# Patient Record
Sex: Male | Born: 1990
Health system: Southern US, Community
[De-identification: ages and names within clinical notes are randomized; demographics above are authoritative.]

## PROBLEM LIST (undated history)

## (undated) DIAGNOSIS — J45909 Unspecified asthma, uncomplicated: Secondary | ICD-10-CM

## (undated) DIAGNOSIS — T7840XA Allergy, unspecified, initial encounter: Secondary | ICD-10-CM

## (undated) DIAGNOSIS — K589 Irritable bowel syndrome without diarrhea: Secondary | ICD-10-CM

## (undated) HISTORY — DX: Irritable bowel syndrome, unspecified: K58.9

## (undated) HISTORY — DX: Unspecified asthma, uncomplicated: J45.909

## (undated) HISTORY — DX: Allergy, unspecified, initial encounter: T78.40XA

---
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
## 2011-01-05 LAB — CBC
HCT: 44.4 % (ref 36.0–49.0)
Hemoglobin: 15.5 g/dL (ref 12.0–16.0)
MCHC: 34.9 g/dL (ref 31.0–37.0)
MCV: 87 fL (ref 78.0–98.0)
Platelets: 164 10*3/uL (ref 150–400)

## 2011-01-05 LAB — DIFFERENTIAL
Eosinophils Absolute: 0.1 10*3/uL (ref 0.0–1.2)
Lymphs Abs: 1.5 10*3/uL (ref 1.1–4.8)
Monocytes Absolute: 0.6 10*3/uL (ref 0.2–1.2)
Monocytes Relative: 6 % (ref 3–11)
Neutro Abs: 9.1 10*3/uL — ABNORMAL HIGH (ref 1.7–8.0)

## 2011-01-05 LAB — BASIC METABOLIC PANEL
BUN: 13 mg/dL (ref 6–23)
CO2: 25 mEq/L (ref 19–32)
Calcium: 9.6 mg/dL (ref 8.4–10.5)
Chloride: 106 mEq/L (ref 96–112)
Glucose, Bld: 99 mg/dL (ref 70–99)

## 2013-08-20 ENCOUNTER — Ambulatory Visit (INDEPENDENT_AMBULATORY_CARE_PROVIDER_SITE_OTHER): Payer: BC Managed Care – PPO | Admitting: Family Medicine

## 2013-08-20 ENCOUNTER — Encounter: Payer: Self-pay | Admitting: Family Medicine

## 2013-08-20 VITALS — BP 128/80 | HR 76 | Temp 98.2°F | Resp 18 | Ht 68.0 in | Wt 139.4 lb

## 2013-08-20 DIAGNOSIS — L259 Unspecified contact dermatitis, unspecified cause: Secondary | ICD-10-CM

## 2013-08-20 DIAGNOSIS — L255 Unspecified contact dermatitis due to plants, except food: Secondary | ICD-10-CM

## 2013-08-20 DIAGNOSIS — L237 Allergic contact dermatitis due to plants, except food: Secondary | ICD-10-CM

## 2013-08-20 MED ORDER — PREDNISONE 20 MG PO TABS: ORAL_TABLET | ORAL | Status: DC

## 2013-08-20 MED ORDER — DIPHENHYDRAMINE HCL 25 MG PO CAPS
25.0000 mg | ORAL_CAPSULE | Freq: Once | ORAL | Status: AC
  Administered 2013-08-20: 25 mg via ORAL

## 2013-08-20 MED ORDER — NON FORMULARY
150.0000 mg | Freq: Once | Status: AC
  Administered 2013-08-20: 150 mg via ORAL

## 2013-08-20 NOTE — Progress Notes (Addendum)
Subjective:    Patient ID: Peter Kirby, male    DOB: 04-Jul-1991, 22 y.o.   MRN: [id number]  This chart was scribed for Meredith Staggers, MD by Greggory Stallion, Medical Scribe. This patient's care was started at 8:46 PM.  Authored by Silas Sacramento, M.D, but unable to change in [facility]   HPI HPI Comments: Peter Kirby is a 22 y.o. male who presents to the office complaining of an allergic reaction that started this morning. Pt states it started with a rash across his abdomen that moved to his groin, shoulders, back and around his lips and he has lip swelling. He has taken one Benadryl pill with no relief. Denies difficulty breathing, SOB. He states he has been hunting in the woods for the last week. Denies history of anaphylaxis or hospitalization due to allergic reactions.   There are no active problems to display for this patient.  No past medical history on file. No past surgical history on file. Allergies not on file Prior to Admission medications   Not on File   History   Social History   Marital Status: Single    Spouse Name: N/A    Number of Children: N/A   Years of Education: N/A   Occupational History   Not on file.   Social History Main Topics   Smoking status: Not on file   Smokeless tobacco: Not on file   Alcohol Use: Not on file   Drug Use: Not on file   Sexual Activity: Not on file   Other Topics Concern   Not on file   Social History Narrative   No narrative on file    Review of Systems  HENT: Positive for facial swelling (lips).   Respiratory: Negative for shortness of breath.   Genitourinary: Negative for discharge, scrotal swelling, difficulty urinating and penile pain.  Skin: Positive for rash.       Objective:   Physical Exam  Vitals reviewed. Constitutional: He is oriented to person, place, and time. He appears well-developed and well-nourished.  HENT:  Head: Normocephalic and atraumatic.  Right Ear: Tympanic membrane,  external ear and ear canal normal.  Left Ear: Tympanic membrane, external ear and ear canal normal.  Nose: No rhinorrhea.  Mouth/Throat: Oropharynx is clear and moist and mucous membranes are normal. No oropharyngeal exudate or posterior oropharyngeal erythema.  No ulcers, rash or blisters in mouth.   Eyes: Conjunctivae and EOM are normal. Pupils are equal, round, and reactive to light.  Erythema on medial canthi and soft tissue around both eyes with minimal induration, with right greater than left, on periorbital skin.   Neck: Neck supple.  Cardiovascular: Normal rate, regular rhythm, normal heart sounds and intact distal pulses.   No murmur heard. Pulmonary/Chest: Effort normal and breath sounds normal. No stridor. He has no wheezes. He has no rhonchi. He has no rales.  Abdominal: Soft. There is no tenderness.  Lymphadenopathy:    He has no cervical adenopathy.  Neurological: He is alert and oriented to person, place, and time.  Skin: Skin is warm and dry. Rash noted.  Multiple coalescent, papular excoriated areas. Patches to right abdomen, 3 patches on right anterior shoulder. Few anterior erythematous patches on the neck. Two excoriated areas to upper back without vesicles or papules. Few patches of excoriated papules on lateral face bilaterally and lower lip right at [place] border. Few small erythematous papules on L scrotum and lower abdominal wall - superior to genital area.  Psychiatric: He has a normal mood and affect. His behavior is normal.   Filed Vitals:   08/20/13 2041  BP: 128/80  Pulse: 76  Temp: 98.2 F (36.8 C)  TempSrc: Oral  Resp: 18  Height: 5\' 8"  (1.727 m)  Weight: 139 lb 6.4 oz (63.231 kg)  SpO2: 98%     Results for orders placed in visit on 08/20/13  GLUCOSE, POCT (MANUAL RESULT ENTRY)      Result Value Range   POC Glucose 78  70 - 99 mg/dl      Assessment & Plan:  Peter Kirby is a 22 y.o. male  Dermatitis, contact - Plan: diphenhydrAMINE  (BENADRYL) capsule 25 mg, POCT glucose (manual entry), NON FORMULARY 150 mg, predniSONE (DELTASONE) 20 MG tablet  Poison ivy - Plan: diphenhydrAMINE (BENADRYL) capsule 25 mg, POCT glucose (manual entry), NON FORMULARY 150 mg, predniSONE (DELTASONE) 20 MG tablet  Rash initially on abdomen, now progressive to genitals, face/periorbital. Suspected poison ivy as cause with hunting in woods this week. No respiratory sx's.  No intraoral lesions noted.   Start prednisone taper, cont Benadryl and Zantac otc prn - dose of each given here. Overnight ER precautions discussed.   Meds ordered this encounter  Medications   dexmethylphenidate (FOCALIN) 10 MG tablet    Sig: Take 10 mg by mouth as needed.   diphenhydrAMINE (BENADRYL) capsule 25 mg    Sig:    NON FORMULARY 150 mg    Sig:     Order Specific Question:  Brand Name:    Answer:  Zantac    Order Specific Question:  Generic name:    Answer:  Ranitadine    Order Specific Question:  Form:    Answer:  Cap [7]    Order Specific Question:  Length of Therapy:    Answer:  1 day    Order Specific Question:  Reason for Non-Formulary    Answer:  Brand name not available    Order Specific Question:  How soon needed? (normally 72 hrs needed to procure):    Answer:  0-24 hrs   predniSONE (DELTASONE) 20 MG tablet    Sig: 3 tabs by mouth each day for 3 days, 2 tabs by mouth each day for 2 days, 1 tab by mouth each day for 2 days, 1/2 tab by mouth each day for 2 days.    Dispense:  16 tablet    Refill:  0    Patient Instructions  Start prednisone tonight, benadryl 25mg  - 1-2 every 4 to 6 hours as needed for itching. You can also take over the counter Zantac (75mg  is ok - twice per day, but 150mg  given to you in the office tonight, as this can also help with itching with allergic reactions. This appears to be due to poison ivy, so should improve as discussed, but return to the clinic or go to the nearest emergency room if any of your symptoms worsen or  new symptoms occur. Poison [employer] ivy is a inflammation of the skin (contact dermatitis) caused by touching the allergens on the leaves of the ivy plant following previous exposure to the plant. The rash usually appears 48 hours after exposure. The rash is usually bumps (papules) or blisters (vesicles) in a linear pattern. Depending on your own sensitivity, the rash may simply cause redness and itching, or it may also progress to blisters which may break open. These must be well cared for to prevent secondary bacterial (germ) infection, followed by scarring.  Keep any open areas dry, clean, dressed, and covered with an antibacterial ointment if needed. The eyes may also get puffy. The puffiness is worst in the morning and gets better as the day progresses. This dermatitis usually heals without scarring, within 2 to 3 weeks without treatment. HOME CARE INSTRUCTIONS  Thoroughly wash with soap and water as soon as you have been exposed to poison ivy. You have about one half hour to remove the plant resin before it will cause the rash. This washing will destroy the oil or antigen on the skin that is causing, or will cause, the rash. Be sure to wash under your fingernails as any plant resin there will continue to spread the rash. Do not rub skin vigorously when washing affected area. Poison ivy cannot spread if no oil from the plant remains on your body. A rash that has progressed to weeping sores will not spread the rash unless you have not washed thoroughly. It is also important to wash any clothes you have been wearing as these may carry active allergens. The rash will return if you wear the unwashed clothing, even several days later. Avoidance of the plant in the future is the best measure. Poison ivy plant can be recognized by the number of leaves. Generally, poison ivy has three leaves with flowering branches on a single stem. Diphenhydramine may be purchased over the counter and used as needed for  itching. Do not drive with this medication if it makes you drowsy.Ask your caregiver about medication for children. SEEK MEDICAL CARE IF:  Open sores develop.  Redness spreads beyond area of rash.  You notice purulent (pus-like) discharge.  You have increased pain.  Other signs of infection develop (such as fever). Document Released: 09/08/2000 Document Revised: 12/04/2011 Document Reviewed: 07/28/2009 [facility] Patient Information 2014 [place], [place].    I personally performed the services described in this documentation, which was scribed in my presence. The recorded information has been reviewed and considered, and addended by me as needed.

## 2013-08-20 NOTE — Patient Instructions (Signed)
Start prednisone tonight, benadryl 25mg  - 1-2 every 4 to 6 hours as needed for itching. You can also take over the counter Zantac (75mg  is ok - twice per day, but 150mg  given to you in the office tonight, as this can also help with itching with allergic reactions. This appears to be due to poison ivy, so should improve as discussed, but return to the clinic or go to the nearest emergency room if any of your symptoms worsen or new symptoms occur. Poison [employer] ivy is a inflammation of the skin (contact dermatitis) caused by touching the allergens on the leaves of the ivy plant following previous exposure to the plant. The rash usually appears 48 hours after exposure. The rash is usually bumps (papules) or blisters (vesicles) in a linear pattern. Depending on your own sensitivity, the rash may simply cause redness and itching, or it may also progress to blisters which may break open. These must be well cared for to prevent secondary bacterial (germ) infection, followed by scarring. Keep any open areas dry, clean, dressed, and covered with an antibacterial ointment if needed. The eyes may also get puffy. The puffiness is worst in the morning and gets better as the day progresses. This dermatitis usually heals without scarring, within 2 to 3 weeks without treatment. HOME CARE INSTRUCTIONS  Thoroughly wash with soap and water as soon as you have been exposed to poison ivy. You have about one half hour to remove the plant resin before it will cause the rash. This washing will destroy the oil or antigen on the skin that is causing, or will cause, the rash. Be sure to wash under your fingernails as any plant resin there will continue to spread the rash. Do not rub skin vigorously when washing affected area. Poison ivy cannot spread if no oil from the plant remains on your body. A rash that has progressed to weeping sores will not spread the rash unless you have not washed thoroughly. It is also important to wash any  clothes you have been wearing as these may carry active allergens. The rash will return if you wear the unwashed clothing, even several days later. Avoidance of the plant in the future is the best measure. Poison ivy plant can be recognized by the number of leaves. Generally, poison ivy has three leaves with flowering branches on a single stem. Diphenhydramine may be purchased over the counter and used as needed for itching. Do not drive with this medication if it makes you drowsy.Ask your caregiver about medication for children. SEEK MEDICAL CARE IF:  Open sores develop.  Redness spreads beyond area of rash.  You notice purulent (pus-like) discharge.  You have increased pain.  Other signs of infection develop (such as fever). Document Released: 09/08/2000 Document Revised: 12/04/2011 Document Reviewed: 07/28/2009 [facility] Patient Information 2014 [place], [place].

## 2015-05-06 ENCOUNTER — Ambulatory Visit (INDEPENDENT_AMBULATORY_CARE_PROVIDER_SITE_OTHER): Payer: BC Managed Care – PPO | Admitting: Otolaryngology

## 2015-05-06 DIAGNOSIS — R040 Epistaxis: Secondary | ICD-10-CM | POA: Diagnosis not present

## 2019-06-11 ENCOUNTER — Other Ambulatory Visit: Payer: Self-pay

## 2019-06-11 DIAGNOSIS — U071 COVID-19: Secondary | ICD-10-CM | POA: Diagnosis not present

## 2019-06-12 LAB — NOVEL CORONAVIRUS, NAA: SARS-CoV-2, NAA: NOT DETECTED

## 2019-06-16 ENCOUNTER — Other Ambulatory Visit: Payer: Self-pay

## 2019-06-16 DIAGNOSIS — Z20822 Contact with and (suspected) exposure to covid-19: Secondary | ICD-10-CM

## 2019-06-17 LAB — NOVEL CORONAVIRUS, NAA: SARS-CoV-2, NAA: NOT DETECTED

## 2019-06-18 ENCOUNTER — Other Ambulatory Visit: Payer: Self-pay

## 2019-06-18 DIAGNOSIS — Z20822 Contact with and (suspected) exposure to covid-19: Secondary | ICD-10-CM

## 2019-06-20 LAB — NOVEL CORONAVIRUS, NAA: SARS-CoV-2, NAA: NOT DETECTED

## 2019-06-25 ENCOUNTER — Other Ambulatory Visit: Payer: Self-pay

## 2019-06-25 DIAGNOSIS — Z20822 Contact with and (suspected) exposure to covid-19: Secondary | ICD-10-CM

## 2019-06-26 LAB — NOVEL CORONAVIRUS, NAA: SARS-CoV-2, NAA: NOT DETECTED

## 2019-09-08 DIAGNOSIS — Z20828 Contact with and (suspected) exposure to other viral communicable diseases: Secondary | ICD-10-CM | POA: Diagnosis not present

## 2019-09-08 DIAGNOSIS — R519 Headache, unspecified: Secondary | ICD-10-CM | POA: Diagnosis not present

## 2019-09-08 DIAGNOSIS — R52 Pain, unspecified: Secondary | ICD-10-CM | POA: Diagnosis not present

## 2019-09-09 DIAGNOSIS — Z20828 Contact with and (suspected) exposure to other viral communicable diseases: Secondary | ICD-10-CM | POA: Diagnosis not present

## 2019-10-09 ENCOUNTER — Other Ambulatory Visit (HOSPITAL_COMMUNITY): Payer: Self-pay | Admitting: Internal Medicine

## 2019-10-09 ENCOUNTER — Ambulatory Visit (HOSPITAL_COMMUNITY)
Admission: RE | Admit: 2019-10-09 | Discharge: 2019-10-09 | Disposition: A | Discharge status: Home / Self Care | Payer: BC Managed Care – PPO | Source: Ambulatory Visit | Attending: Internal Medicine | Admitting: Internal Medicine

## 2019-10-09 ENCOUNTER — Other Ambulatory Visit: Payer: Self-pay

## 2019-10-09 DIAGNOSIS — R0602 Shortness of breath: Secondary | ICD-10-CM | POA: Diagnosis not present

## 2020-05-17 DIAGNOSIS — J069 Acute upper respiratory infection, unspecified: Secondary | ICD-10-CM | POA: Diagnosis not present

## 2020-05-17 DIAGNOSIS — Z20822 Contact with and (suspected) exposure to covid-19: Secondary | ICD-10-CM | POA: Diagnosis not present

## 2020-05-17 DIAGNOSIS — B009 Herpesviral infection, unspecified: Secondary | ICD-10-CM | POA: Diagnosis not present

## 2020-05-17 DIAGNOSIS — R070 Pain in throat: Secondary | ICD-10-CM | POA: Diagnosis not present

## 2020-05-19 ENCOUNTER — Other Ambulatory Visit: Payer: BC Managed Care – PPO

## 2020-05-19 ENCOUNTER — Other Ambulatory Visit: Payer: Self-pay

## 2020-05-19 DIAGNOSIS — Z20822 Contact with and (suspected) exposure to covid-19: Secondary | ICD-10-CM | POA: Diagnosis not present

## 2020-05-21 LAB — SARS-COV-2, NAA 2 DAY TAT

## 2020-05-21 LAB — NOVEL CORONAVIRUS, NAA: SARS-CoV-2, NAA: NOT DETECTED

## 2020-07-23 DIAGNOSIS — R197 Diarrhea, unspecified: Secondary | ICD-10-CM | POA: Diagnosis not present

## 2020-07-27 DIAGNOSIS — R197 Diarrhea, unspecified: Secondary | ICD-10-CM | POA: Diagnosis not present

## 2020-07-27 DIAGNOSIS — K589 Irritable bowel syndrome without diarrhea: Secondary | ICD-10-CM | POA: Diagnosis not present

## 2020-09-01 ENCOUNTER — Encounter: Payer: Self-pay | Admitting: Internal Medicine

## 2020-10-21 ENCOUNTER — Telehealth: Payer: Self-pay | Admitting: *Deleted

## 2020-10-21 ENCOUNTER — Encounter: Payer: Self-pay | Admitting: Internal Medicine

## 2020-10-21 ENCOUNTER — Other Ambulatory Visit: Payer: Self-pay

## 2020-10-21 ENCOUNTER — Ambulatory Visit (INDEPENDENT_AMBULATORY_CARE_PROVIDER_SITE_OTHER): Payer: BC Managed Care – PPO | Admitting: Nurse Practitioner

## 2020-10-21 ENCOUNTER — Encounter: Payer: Self-pay | Admitting: Nurse Practitioner

## 2020-10-21 DIAGNOSIS — R197 Diarrhea, unspecified: Secondary | ICD-10-CM | POA: Insufficient documentation

## 2020-10-21 DIAGNOSIS — R634 Abnormal weight loss: Secondary | ICD-10-CM | POA: Insufficient documentation

## 2020-10-21 DIAGNOSIS — R109 Unspecified abdominal pain: Secondary | ICD-10-CM

## 2020-10-21 HISTORY — DX: Diarrhea, unspecified: R19.7

## 2020-10-21 HISTORY — DX: Unspecified abdominal pain: R10.9

## 2020-10-21 HISTORY — DX: Abnormal weight loss: R63.4

## 2020-10-21 MED ORDER — DICYCLOMINE HCL 10 MG PO CAPS: 10.0000 mg | ORAL_CAPSULE | Freq: Three times a day (TID) | ORAL | 2 refills | Status: DC | PRN

## 2020-10-21 NOTE — Telephone Encounter (Signed)
Called pt and provided CT appt details. This date/time did not work and so he has been provided # to CS to r/s.  He has been scheduled for TCS with Dr. Jena Gauss, propofol, ASA 1/2 on 2/18 at 1:00pm. Aware will send instructions with covid test appt. He voiced understanding/   PA for CT A/P approved via [employer]. Auth# [id number] DOS 10/21/2020-11/19/2020 No PA is required for procedure.

## 2020-10-21 NOTE — Patient Instructions (Signed)
Your health issues we discussed today were:   Abdominal pain with diarrhea and significant weight loss: 1. As we discussed, this very well could be your previous IBS symptoms 2. However, because things feel "different" than your typical IBS we will plan for further evaluation 3. For now, start taking a probiotic for 1 to 2 months to see if this helps with your worsening loose stools after the foodborne illness 4. We've had good success with Align, Restora, [employer], [employer], and [employer]; however there are many good options and you can discuss with the pharmacist if you would like further guidance. 5. As we discussed, your stool test did not show any significant colon infection 6. I sent a prescription to your pharmacy for Bentyl 10 mg.  You can take this 3 times a day, as needed for abdominal pain or diarrhea 7. This is not to "fix everything" but to help you feel better while we continue to evaluate your symptoms 8. We will help schedule the CT of your abdomen and pelvis for you to check for concerning findings 9. We will also schedule you for colonoscopy to further evaluate 10. Further recommendations will follow your testing and colonoscopy 11. Call us if you have any worsening or severe symptoms  Overall I recommend:  1. Continue other current medications 2. Return for follow-up in 3 months 3. Call us if you have any questions or concerns   ---------------------------------------------------------------  I am glad you have gotten your COVID-19 vaccination!  Even though you are fully vaccinated you should continue to follow CDC and state/local guidelines.  ---------------------------------------------------------------   At [facility] we value your feedback. You may receive a survey about your visit today. Please share your experience as we strive to create trusting relationships with our patients to provide genuine, compassionate, quality care.  We  appreciate your understanding and patience as we review any laboratory studies, imaging, and other diagnostic tests that are ordered as we care for you. Our office policy is 5 business days for review of these results, and any emergent or urgent results are addressed in a timely manner for your best interest. If you do not hear from our office in 1 week, please contact us.   We also encourage the use of MyChart, which contains your medical information for your review as well. If you are not enrolled in this feature, an access code is on this after visit summary for your convenience. Thank you for allowing [place] to be involved in your care.  It was great to see you today!  I hope you have a safe and warm winter!!

## 2020-10-21 NOTE — Progress Notes (Signed)
Primary Care Physician:  Asencion Noble, MD Primary Gastroenterologist:  Dr. Gala Romney  Chief Complaint  Patient presents with  . Diarrhea    IBS symptoms  . Abdominal Pain    HPI:   Peter Kirby is a 30 y.o. male who presents on referral from primary care due to IBS symptoms.  Reviewed information provided with referral including office visit dated 07/23/2020.  At that time noted worsening diarrhea with history of IBS with recent watery stools 3 or more times a day with abdominal cramping and bloating.  No sugarless gum or candies.  Recommended GI pathogen panel and Lomotil, avoid dairy.  Query candidate for Viberzi.  GI pathogen panel was completed 07/27/2020 which was negative.  C. difficile also noted to be negative on GI path panel specifically.  No history of colonoscopy found in our system.  Today he states he is doing okay overall. He states he's always had IBS, waxing and waning in severity. Worse symptoms in grade and high school; improved in college. Recently he has begun having recurrent symptoms. Restarted September or October of 2021. Stools were watery, didn't seem like "normal IBS" symptoms for him. At that time he had a test which showed "food-borne illness". His clear/watery stools improved, but still having 3-4 loose diarrhea stools a day. 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 Eats healthy, significant fiber, low fat. He does try and stay active/exercise. He does have significant abdominal pain with his stools which can be significant/severe. Doesn't feel like he has a lot of life stress. Pain is crampy, lower abdomen; not sharp. Denies hematochezia, melena, N/V, fever, chills. He has had some weight loss (about 10 lbs in the past 3 months). His diet and activity hasn't changed. Denies URI or flu-like symptoms. Denies loss of sense of taste or smell. The patient has received COVID-19 vaccination(s). Denies chest pain, dyspnea,  dizziness, lightheadedness, syncope, near syncope. Denies any other upper or lower GI symptoms.  Past Medical History:  Diagnosis Date  . Asthma    resolved; childhood exercise induced    Past Surgical History:  Procedure Laterality Date  . NO PAST SURGERIES      No current outpatient medications on file.   No current facility-administered medications for this visit.    Allergies as of 10/21/2020 - Review Complete 10/21/2020  Allergen Reaction Noted  . Bee venom  08/20/2013    Family History  Problem Relation Age of Onset  . Diabetes Father   . Diabetes Maternal Grandfather   . COPD Paternal Grandmother   . Diabetes Paternal Grandfather   . Colon cancer Neg Hx   . Gastric cancer Neg Hx   . Esophageal cancer Neg Hx     Social History   Socioeconomic History  . Marital status: Single    Spouse name: Not on file  . Number of children: Not on file  . Years of education: Not on file  . Highest education level: Not on file  Occupational History  . Not on file  Tobacco Use  . Smoking status: Never Smoker  . Smokeless tobacco: Never Used  Vaping Use  . Vaping Use: Never used  Substance and Sexual Activity  . Alcohol use: Yes    Comment: social on weekend typically 2-4 drinks  . Drug use: Never  . Sexual activity: Not on file  Other Topics Concern  . Not on file  Social History Narrative  . Not on  file   Social Determinants of Health   Financial Resource Strain: Not on file  Food Insecurity: Not on file  Transportation Needs: Not on file  Physical Activity: Not on file  Stress: Not on file  Social Connections: Not on file  Intimate Partner Violence: Not on file    Subjective: Review of Systems  Constitutional: Positive for weight loss. Negative for chills, fever and malaise/fatigue.  HENT: Negative for congestion and sore throat.   Respiratory: Negative for cough and shortness of breath.   Cardiovascular: Negative for chest pain and palpitations.   Gastrointestinal: Positive for abdominal pain and diarrhea. Negative for blood in stool, constipation, heartburn, melena, nausea and vomiting.  Musculoskeletal: Negative for joint pain and myalgias.  Skin: Negative for rash.  Neurological: Negative for dizziness and weakness.  Endo/Heme/Allergies: Does not bruise/bleed easily.  Psychiatric/Behavioral: Negative for depression. The patient is not nervous/anxious.   All other systems reviewed and are negative.      Objective: BP 119/81   Pulse 75   Temp (!) 97.5 F (36.4 C) (Temporal)   Ht _0  (1.727 m)   Wt 159 lb 1.6 oz (72.2 kg)   BMI 24.19 kg/m  Physical Exam Vitals and nursing note reviewed.  Constitutional:      General: He is not in acute distress.    Appearance: Normal appearance. He is well-developed and normal weight. He is not ill-appearing, toxic-appearing or diaphoretic.  HENT:     Head: Normocephalic and atraumatic.     Nose: No congestion or rhinorrhea.  Eyes:     General: No scleral icterus. Cardiovascular:     Rate and Rhythm: Normal rate and regular rhythm.     Heart sounds: Normal heart sounds.  Pulmonary:     Effort: Pulmonary effort is normal.     Breath sounds: Normal breath sounds.  Abdominal:     General: Bowel sounds are normal. There is no distension.     Palpations: Abdomen is soft. There is no hepatomegaly, splenomegaly or mass.     Tenderness: There is no abdominal tenderness. There is no guarding or rebound.     Hernia: No hernia is present.  Musculoskeletal:     Cervical back: Neck supple.  Skin:    General: Skin is warm and dry.     Coloration: Skin is not jaundiced.     Findings: No bruising or rash.  Neurological:     General: No focal deficit present.     Mental Status: He is alert and oriented to person, place, and time. Mental status is at baseline.  Psychiatric:        Mood and Affect: Mood normal.        Behavior: Behavior normal.        Thought Content: Thought content  normal.      Assessment:  Pleasant 30 year old male presents on referral from primary care due to IBS symptoms.  Previous GI pathogen panel completed negative for C. difficile and others.  No history of colonoscopy.  IBS symptoms (diarrhea and abdominal pain): Notes chronic IBS since childhood, waxing and waning in severity improved during college years but has restarted as of about 4 months ago.  Symptoms seemed a bit atypical for his normal IBS symptoms persistent 3-4 loose stools a day, was told he had food poisoning prior to that with worsening stools.  Associated abdominal pain in the lower abdomen.  Weight loss: He has noted some unintentional weight loss of about 10 pounds in the past  3 months despite no change in activity or diet.  Given his total consolation symptoms this is a bit concerning and we will proceed with colonoscopy to evaluate.  However, if he did have food poisoning and is having persistent diarrhea this could be responsible as well.  Proceed with colonoscopy on propofol/MAC by Dr. Gala Romney in near future: the risks, benefits, and alternatives have been discussed with the patient in detail. The patient states understanding and desires to proceed.  The patient is not currently on any medications.  Given his age we will plan for the procedure on propofol/MAC to promote adequate sedation.  ASA I/II   Plan: 1. Trialof Align x 1-2 months 2. Bentyl 10 mg tid prn 3. CT Abdomen/Pelvis 4. Colonoscopy as described above 5. Follow-up in 3 months    Thank you for allowing [place] to participate in the care of [place], DNP, AGNP-C Adult & Gerontological Nurse Practitioner [facility]   10/21/2020 9:39 AM   Disclaimer: This note was dictated with voice recognition software. Similar sounding words can inadvertently be transcribed and may not be corrected upon review.

## 2020-10-21 NOTE — H&P (View-Only) (Signed)
Primary Care Physician:  Asencion Noble, MD Primary Gastroenterologist:  Dr. Gala Romney  Chief Complaint  Patient presents with  . Diarrhea    IBS symptoms  . Abdominal Pain    HPI:   Peter Kirby is a 30 y.o. male who presents on referral from primary care due to IBS symptoms.  Reviewed information provided with referral including office visit dated 07/23/2020.  At that time noted worsening diarrhea with history of IBS with recent watery stools 3 or more times a day with abdominal cramping and bloating.  No sugarless gum or candies.  Recommended GI pathogen panel and Lomotil, avoid dairy.  Query candidate for Viberzi.  GI pathogen panel was completed 07/27/2020 which was negative.  C. difficile also noted to be negative on GI path panel specifically.  No history of colonoscopy found in our system.  Today he states he is doing okay overall. He states he's always had IBS, waxing and waning in severity. Worse symptoms in grade and high school; improved in college. Recently he has begun having recurrent symptoms. Restarted September or October of 2021. Stools were watery, didn't seem like "normal IBS" symptoms for him. At that time he had a test which showed "food-borne illness". His clear/watery stools improved, but still having 3-4 loose diarrhea stools a day. 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 Eats healthy, significant fiber, low fat. He does try and stay active/exercise. He does have significant abdominal pain with his stools which can be significant/severe. Doesn't feel like he has a lot of life stress. Pain is crampy, lower abdomen; not sharp. Denies hematochezia, melena, N/V, fever, chills. He has had some weight loss (about 10 lbs in the past 3 months). His diet and activity hasn't changed. Denies URI or flu-like symptoms. Denies loss of sense of taste or smell. The patient has received COVID-19 vaccination(s). Denies chest pain, dyspnea,  dizziness, lightheadedness, syncope, near syncope. Denies any other upper or lower GI symptoms.  Past Medical History:  Diagnosis Date  . Asthma    resolved; childhood exercise induced    Past Surgical History:  Procedure Laterality Date  . NO PAST SURGERIES      No current outpatient medications on file.   No current facility-administered medications for this visit.    Allergies as of 10/21/2020 - Review Complete 10/21/2020  Allergen Reaction Noted  . Bee venom  08/20/2013    Family History  Problem Relation Age of Onset  . Diabetes Father   . Diabetes Maternal Grandfather   . COPD Paternal Grandmother   . Diabetes Paternal Grandfather   . Colon cancer Neg Hx   . Gastric cancer Neg Hx   . Esophageal cancer Neg Hx     Social History   Socioeconomic History  . Marital status: Single    Spouse name: Not on file  . Number of children: Not on file  . Years of education: Not on file  . Highest education level: Not on file  Occupational History  . Not on file  Tobacco Use  . Smoking status: Never Smoker  . Smokeless tobacco: Never Used  Vaping Use  . Vaping Use: Never used  Substance and Sexual Activity  . Alcohol use: Yes    Comment: social on weekend typically 2-4 drinks  . Drug use: Never  . Sexual activity: Not on file  Other Topics Concern  . Not on file  Social History Narrative  . Not on  file   Social Determinants of Health   Financial Resource Strain: Not on file  Food Insecurity: Not on file  Transportation Needs: Not on file  Physical Activity: Not on file  Stress: Not on file  Social Connections: Not on file  Intimate Partner Violence: Not on file    Subjective: Review of Systems  Constitutional: Positive for weight loss. Negative for chills, fever and malaise/fatigue.  HENT: Negative for congestion and sore throat.   Respiratory: Negative for cough and shortness of breath.   Cardiovascular: Negative for chest pain and palpitations.   Gastrointestinal: Positive for abdominal pain and diarrhea. Negative for blood in stool, constipation, heartburn, melena, nausea and vomiting.  Musculoskeletal: Negative for joint pain and myalgias.  Skin: Negative for rash.  Neurological: Negative for dizziness and weakness.  Endo/Heme/Allergies: Does not bruise/bleed easily.  Psychiatric/Behavioral: Negative for depression. The patient is not nervous/anxious.   All other systems reviewed and are negative.      Objective: BP 119/81   Pulse 75   Temp (!) 97.5 F (36.4 C) (Temporal)   Ht _0  (1.727 m)   Wt 159 lb 1.6 oz (72.2 kg)   BMI 24.19 kg/m  Physical Exam Vitals and nursing note reviewed.  Constitutional:      General: He is not in acute distress.    Appearance: Normal appearance. He is well-developed and normal weight. He is not ill-appearing, toxic-appearing or diaphoretic.  HENT:     Head: Normocephalic and atraumatic.     Nose: No congestion or rhinorrhea.  Eyes:     General: No scleral icterus. Cardiovascular:     Rate and Rhythm: Normal rate and regular rhythm.     Heart sounds: Normal heart sounds.  Pulmonary:     Effort: Pulmonary effort is normal.     Breath sounds: Normal breath sounds.  Abdominal:     General: Bowel sounds are normal. There is no distension.     Palpations: Abdomen is soft. There is no hepatomegaly, splenomegaly or mass.     Tenderness: There is no abdominal tenderness. There is no guarding or rebound.     Hernia: No hernia is present.  Musculoskeletal:     Cervical back: Neck supple.  Skin:    General: Skin is warm and dry.     Coloration: Skin is not jaundiced.     Findings: No bruising or rash.  Neurological:     General: No focal deficit present.     Mental Status: He is alert and oriented to person, place, and time. Mental status is at baseline.  Psychiatric:        Mood and Affect: Mood normal.        Behavior: Behavior normal.        Thought Content: Thought content  normal.      Assessment:  Pleasant 30 year old male presents on referral from primary care due to IBS symptoms.  Previous GI pathogen panel completed negative for C. difficile and others.  No history of colonoscopy.  IBS symptoms (diarrhea and abdominal pain): Notes chronic IBS since childhood, waxing and waning in severity improved during college years but has restarted as of about 4 months ago.  Symptoms seemed a bit atypical for his normal IBS symptoms persistent 3-4 loose stools a day, was told he had food poisoning prior to that with worsening stools.  Associated abdominal pain in the lower abdomen.  Weight loss: He has noted some unintentional weight loss of about 10 pounds in the past  3 months despite no change in activity or diet.  Given his total consolation symptoms this is a bit concerning and we will proceed with colonoscopy to evaluate.  However, if he did have food poisoning and is having persistent diarrhea this could be responsible as well.  Proceed with colonoscopy on propofol/MAC by Dr. Gala Romney in near future: the risks, benefits, and alternatives have been discussed with the patient in detail. The patient states understanding and desires to proceed.  The patient is not currently on any medications.  Given his age we will plan for the procedure on propofol/MAC to promote adequate sedation.  ASA I/II   Plan: 1. Trialof Align x 1-2 months 2. Bentyl 10 mg tid prn 3. CT Abdomen/Pelvis 4. Colonoscopy as described above 5. Follow-up in 3 months    Thank you for allowing [place] to participate in the care of [place], DNP, AGNP-C Adult & Gerontological Nurse Practitioner [facility]   10/21/2020 9:39 AM   Disclaimer: This note was dictated with voice recognition software. Similar sounding words can inadvertently be transcribed and may not be corrected upon review.

## 2020-11-04 ENCOUNTER — Ambulatory Visit (HOSPITAL_COMMUNITY): Payer: BC Managed Care – PPO

## 2020-11-08 ENCOUNTER — Encounter (HOSPITAL_COMMUNITY): Payer: Self-pay

## 2020-11-08 ENCOUNTER — Other Ambulatory Visit: Payer: Self-pay

## 2020-11-08 ENCOUNTER — Ambulatory Visit (HOSPITAL_COMMUNITY)
Admission: RE | Admit: 2020-11-08 | Discharge: 2020-11-08 | Disposition: A | Discharge status: Home / Self Care | Payer: BC Managed Care – PPO | Source: Ambulatory Visit | Attending: Nurse Practitioner | Admitting: Nurse Practitioner

## 2020-11-08 DIAGNOSIS — R197 Diarrhea, unspecified: Secondary | ICD-10-CM | POA: Insufficient documentation

## 2020-11-08 DIAGNOSIS — R109 Unspecified abdominal pain: Secondary | ICD-10-CM | POA: Diagnosis not present

## 2020-11-08 MED ORDER — IOHEXOL 300 MG/ML  SOLN
100.0000 mL | Freq: Once | INTRAMUSCULAR | Status: AC | PRN
  Administered 2020-11-08: 100 mL via INTRAVENOUS

## 2020-11-09 ENCOUNTER — Telehealth: Payer: Self-pay | Admitting: Nurse Practitioner

## 2020-11-09 DIAGNOSIS — R634 Abnormal weight loss: Secondary | ICD-10-CM

## 2020-11-09 DIAGNOSIS — R197 Diarrhea, unspecified: Secondary | ICD-10-CM

## 2020-11-09 DIAGNOSIS — R109 Unspecified abdominal pain: Secondary | ICD-10-CM

## 2020-11-09 NOTE — Telephone Encounter (Signed)
Spoke with pt. Pt was notified of Wynne Dust, NP and Dr. Jeanella Flattery recommendations. Lab orders were mailed to pt per pts request.

## 2020-11-09 NOTE — Telephone Encounter (Signed)
Please tell the patient that I was reviewing his case with Dr. Jena Gauss who agreed with current recommendations, However, given his age, ethnicity, and other risk factors I'd like to check him for celiac disease as well (allergy to gluten which is the protein in wheat). This is done with a simple blood test, which I have ordered. He can have this done at his convenience. Let us know if he has any questions or concerns.

## 2020-11-10 ENCOUNTER — Other Ambulatory Visit: Payer: Self-pay

## 2020-11-10 ENCOUNTER — Other Ambulatory Visit (HOSPITAL_COMMUNITY)
Admission: RE | Admit: 2020-11-10 | Discharge: 2020-11-10 | Disposition: A | Discharge status: Home / Self Care | Payer: BC Managed Care – PPO | Source: Ambulatory Visit | Attending: Internal Medicine | Admitting: Internal Medicine

## 2020-11-10 DIAGNOSIS — Z20822 Contact with and (suspected) exposure to covid-19: Secondary | ICD-10-CM | POA: Insufficient documentation

## 2020-11-10 DIAGNOSIS — Z01812 Encounter for preprocedural laboratory examination: Secondary | ICD-10-CM | POA: Insufficient documentation

## 2020-11-10 DIAGNOSIS — R1084 Generalized abdominal pain: Secondary | ICD-10-CM | POA: Diagnosis not present

## 2020-11-10 DIAGNOSIS — Z8719 Personal history of other diseases of the digestive system: Secondary | ICD-10-CM | POA: Diagnosis not present

## 2020-11-10 DIAGNOSIS — R197 Diarrhea, unspecified: Secondary | ICD-10-CM | POA: Diagnosis not present

## 2020-11-10 DIAGNOSIS — R634 Abnormal weight loss: Secondary | ICD-10-CM | POA: Diagnosis not present

## 2020-11-11 LAB — SARS CORONAVIRUS 2 (TAT 6-24 HRS): SARS Coronavirus 2: NEGATIVE

## 2020-11-12 ENCOUNTER — Ambulatory Visit (HOSPITAL_COMMUNITY): Payer: BC Managed Care – PPO | Admitting: Anesthesiology

## 2020-11-12 ENCOUNTER — Encounter (HOSPITAL_COMMUNITY)
Admission: RE | Disposition: A | Discharge status: Home / Self Care | Payer: Self-pay | Source: Home / Self Care | Attending: Internal Medicine

## 2020-11-12 ENCOUNTER — Ambulatory Visit (HOSPITAL_COMMUNITY)
Admission: RE | Admit: 2020-11-12 | Discharge: 2020-11-12 | Disposition: A | Discharge status: Home / Self Care | Payer: BC Managed Care – PPO | Attending: Internal Medicine | Admitting: Internal Medicine

## 2020-11-12 ENCOUNTER — Encounter (HOSPITAL_COMMUNITY): Payer: Self-pay | Admitting: Internal Medicine

## 2020-11-12 ENCOUNTER — Other Ambulatory Visit: Payer: Self-pay

## 2020-11-12 DIAGNOSIS — Z8719 Personal history of other diseases of the digestive system: Secondary | ICD-10-CM | POA: Diagnosis not present

## 2020-11-12 DIAGNOSIS — Z20822 Contact with and (suspected) exposure to covid-19: Secondary | ICD-10-CM | POA: Diagnosis not present

## 2020-11-12 DIAGNOSIS — R197 Diarrhea, unspecified: Secondary | ICD-10-CM | POA: Insufficient documentation

## 2020-11-12 DIAGNOSIS — R1084 Generalized abdominal pain: Secondary | ICD-10-CM | POA: Diagnosis not present

## 2020-11-12 DIAGNOSIS — R634 Abnormal weight loss: Secondary | ICD-10-CM | POA: Diagnosis not present

## 2020-11-12 DIAGNOSIS — R109 Unspecified abdominal pain: Secondary | ICD-10-CM | POA: Diagnosis not present

## 2020-11-12 HISTORY — PX: COLONOSCOPY WITH PROPOFOL: SHX5780

## 2020-11-12 HISTORY — PX: BIOPSY: SHX5522

## 2020-11-12 SURGERY — COLONOSCOPY WITH PROPOFOL
Anesthesia: General

## 2020-11-12 MED ORDER — PROPOFOL 500 MG/50ML IV EMUL
INTRAVENOUS | Status: DC | PRN
  Administered 2020-11-12: 150 ug/kg/min via INTRAVENOUS

## 2020-11-12 MED ORDER — PROPOFOL 10 MG/ML IV BOLUS
INTRAVENOUS | Status: DC | PRN
  Administered 2020-11-12: 50 mg via INTRAVENOUS

## 2020-11-12 MED ORDER — STERILE WATER FOR IRRIGATION IR SOLN
Status: DC | PRN
  Administered 2020-11-12: 100 mL

## 2020-11-12 MED ORDER — LACTATED RINGERS IV SOLN: INTRAVENOUS | Status: DC

## 2020-11-12 NOTE — Anesthesia Preprocedure Evaluation (Addendum)
Anesthesia Evaluation  Patient identified by MRN, date of birth, ID band Patient awake    Reviewed: Allergy & Precautions, NPO status , Patient's Chart, lab work & pertinent test results  History of Anesthesia Complications Negative for: history of anesthetic complications  Airway Mallampati: II  TM Distance: >3 FB Neck ROM: Full    Dental  (+) Dental Advisory Given, Implants   Pulmonary asthma ,    Pulmonary exam normal breath sounds clear to auscultation       Cardiovascular Exercise Tolerance: Good Normal cardiovascular exam Rhythm:Regular Rate:Normal     Neuro/Psych negative neurological ROS  negative psych ROS   GI/Hepatic negative GI ROS, (+)     substance abuse  alcohol use,   Endo/Other  negative endocrine ROS  Renal/GU negative Renal ROS     Musculoskeletal negative musculoskeletal ROS (+)   Abdominal   Peds  Hematology negative hematology ROS (+)   Anesthesia Other Findings   Reproductive/Obstetrics negative OB ROS                            Anesthesia Physical Anesthesia Plan  ASA: II  Anesthesia Plan: General   Post-op Pain Management:    Induction: Intravenous  PONV Risk Score and Plan: TIVA  Airway Management Planned: Nasal Cannula and Natural Airway  Additional Equipment:   Intra-op Plan:   Post-operative Plan: Extubation in OR  Informed Consent: I have reviewed the patients History and Physical, chart, labs and discussed the procedure including the risks, benefits and alternatives for the proposed anesthesia with the patient or authorized representative who has indicated his/her understanding and acceptance.       Plan Discussed with: CRNA and Surgeon  Anesthesia Plan Comments:        Anesthesia Quick Evaluation

## 2020-11-12 NOTE — Transfer of Care (Signed)
Immediate Anesthesia Transfer of Care Note  Patient: Peter Kirby  Procedure(s) Performed: COLONOSCOPY WITH PROPOFOL (N/A ) BIOPSY  Patient Location: PACU  Anesthesia Type:General  Level of Consciousness: awake, alert , oriented and patient cooperative  Airway & Oxygen Therapy: Patient Spontanous Breathing  Post-op Assessment: Report given to RN, Post -op Vital signs reviewed and stable and Patient moving all extremities X 4  Post vital signs: Reviewed and stable  Last Vitals:  Vitals Value Taken Time  BP    Temp    Pulse    Resp    SpO2      Last Pain:  Vitals:   11/12/20 1241  TempSrc:   PainSc: 0-No pain      Patients Stated Pain Goal: 2 (11/12/20 1200)  Complications: No complications documented.

## 2020-11-12 NOTE — Anesthesia Postprocedure Evaluation (Signed)
Anesthesia Post Note  Patient: GEOVANNIE VILAR  Procedure(s) Performed: COLONOSCOPY WITH PROPOFOL (N/A ) BIOPSY  Patient location during evaluation: Phase II Anesthesia Type: General Level of consciousness: awake, oriented, awake and alert and patient cooperative Pain management: pain level controlled Vital Signs Assessment: post-procedure vital signs reviewed and stable Respiratory status: spontaneous breathing, respiratory function stable and nonlabored ventilation Cardiovascular status: stable Postop Assessment: no apparent nausea or vomiting Anesthetic complications: no   No complications documented.   Last Vitals:  Vitals:   11/12/20 1200 11/12/20 1309  BP: (!) 125/92   Pulse: 90   Resp: 15 16  Temp: 36.9 C   SpO2: 100% 97%    Last Pain:  Vitals:   11/12/20 1309  TempSrc: Oral  PainSc:                  Madison Hickman

## 2020-11-12 NOTE — Op Note (Signed)
[facility] Patient Name: Peter Kirby Procedure Date: 11/12/2020 11:47 AM MRN: [id number] Date of Birth: 1991-01-10 Attending MD: Gennette Pac , MD CSN: [id number] Age: 30 Admit Type: Outpatient Procedure:                Colonoscopy Indications:              Generalized abdominal pain, Clinically significant                            diarrhea of unexplained origin, Weight loss Providers:                Gennette Pac, MD, Edrick Kins, RN, Edythe Clarity, Technician Referring MD:              Medicines:                Propofol per Anesthesia Complications:            No immediate complications. Estimated Blood Loss:     Estimated blood loss was minimal. Procedure:                Pre-Anesthesia Assessment:                           - Prior to the procedure, a History and Physical                            was performed, and patient medications and                            allergies were reviewed. The patient's tolerance of                            previous anesthesia was also reviewed. The risks                            and benefits of the procedure and the sedation                            options and risks were discussed with the patient.                            All questions were answered, and informed consent                            was obtained. Prior Anticoagulants: The patient has                            taken no previous anticoagulant or antiplatelet                            agents. ASA Grade Assessment: II - A patient with  mild systemic disease. After reviewing the risks                            and benefits, the patient was deemed in                            satisfactory condition to undergo the procedure.                           After obtaining informed consent, the colonoscope                            was passed under direct vision. Throughout the                             procedure, the patient's blood pressure, pulse, and                            oxygen saturations were monitored continuously. The                            CF-[id number] ([id number]) scope was introduced through                            the anus and advanced to the 15 cm into the ileum.                            The colonoscopy was performed without difficulty.                            The patient tolerated the procedure well. The                            quality of the bowel preparation was adequate. Scope In: 12:48:34 PM Scope Out: 1:04:27 PM Scope Withdrawal Time: 0 hours 11 minutes 55 seconds  Total Procedure Duration: 0 hours 15 minutes 53 seconds  Findings:      The perianal and digital rectal examinations were normal.      The colon (entire examined portion) appeared normal. Distal 15 cm of       terminal ileum appeared normal. Biopsy x2 of sigmoid taken. Impression:               - The entire examined colon is normal. Normal                            appearing terminal ileum; status post sigmoid                            biopsy. No evidence of inflammatory bowel disease                            seen on today's examination.                           -  Lifelong intermittent abdominal cramps and                            diarrhea. He developed significant worsening of                            symptoms last fall. Gastrointestinal pathology                            panel was positive for Sapovirus. 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 However, he needs a celiac screen prior                            to making a firm diagnosis of IBS. Moderate Sedation:      Moderate (conscious) sedation was personally administered by an       anesthesia professional. The following parameters were monitored: oxygen       saturation, heart rate, blood pressure, respiratory rate, EKG, adequacy       of  pulmonary ventilation, and response to care. Recommendation:           - Patient has a contact number available for                            emergencies. The signs and symptoms of potential                            delayed complications were discussed with the                            patient. Return to normal activities tomorrow.                            Written discharge instructions were provided to the                            patient.                           - Advance diet as tolerated. Blood drawn for celiac                            screening today. Levsin 0.125 mg sublingual                            tablets. 1 under the tongue before meals and at                            bedtime as needed for abdominal cramps and  diarrhea. Further recommendations to follow. Procedure Code(s):        --- Professional ---                           [phone], Colonoscopy, flexible; diagnostic, including                            collection of specimen(s) by brushing or washing,                            when performed (separate procedure) Diagnosis Code(s):        --- Professional ---                           R10.84, Generalized abdominal pain                           R19.7, Diarrhea, unspecified                           R63.4, Abnormal weight loss CPT copyright 2019 American Medical Association. All rights reserved. The codes documented in this report are preliminary and upon coder review may  be revised to meet current compliance requirements. Peter Kirby. Peter Abbett, MD Gennette Pac, MD 11/12/2020 1:26:12 PM This report has been signed electronically. Number of Addenda: 0

## 2020-11-12 NOTE — Discharge Instructions (Signed)
  Colonoscopy Discharge Instructions  Read the instructions outlined below and refer to this sheet in the next few weeks. These discharge instructions provide you with general information on caring for yourself after you leave the hospital. Your doctor may also give you specific instructions. While your treatment has been planned according to the most current medical practices available, unavoidable complications occasionally occur. If you have any problems or questions after discharge, call Dr. Jena Gauss at [phone]. ACTIVITY  You may resume your regular activity, but move at a slower pace for the next 24 hours.   Take frequent rest periods for the next 24 hours.   Walking will help get rid of the air and reduce the bloated feeling in your belly (abdomen).   No driving for 24 hours (because of the medicine (anesthesia) used during the test).    Do not sign any important legal documents or operate any machinery for 24 hours (because of the anesthesia used during the test).  NUTRITION  Drink plenty of fluids.   You may resume your normal diet as instructed by your doctor.   Begin with a light meal and progress to your normal diet. Heavy or fried foods are harder to digest and may make you feel sick to your stomach (nauseated).   Avoid alcoholic beverages for 24 hours or as instructed.  MEDICATIONS  You may resume your normal medications unless your doctor tells you otherwise.  WHAT YOU CAN EXPECT TODAY  Some feelings of bloating in the abdomen.   Passage of more gas than usual.   Spotting of blood in your stool or on the toilet paper.  IF YOU HAD POLYPS REMOVED DURING THE COLONOSCOPY:  No aspirin products for 7 days or as instructed.   No alcohol for 7 days or as instructed.   Eat a soft diet for the next 24 hours.  FINDING OUT THE RESULTS OF YOUR TEST Not all test results are available during your visit. If your test results are not back during the visit, make an appointment  with your caregiver to find out the results. Do not assume everything is normal if you have not heard from your caregiver or the medical facility. It is important for you to follow up on all of your test results.  SEEK IMMEDIATE MEDICAL ATTENTION IF:  You have more than a spotting of blood in your stool.   Your belly is swollen (abdominal distention).   You are nauseated or vomiting.   You have a temperature over 101.   You have abdominal pain or discomfort that is severe or gets worse throughout the day.   Your colon and small intestine look good today.  Couple of small biopsies of your colon taken  We will draw blood for serum TTG IgA and total IgA level (to check for celiac disease)  Trial of Levsin 0.125 mg sublingual tablets.  Put 1 under the tongue before meals and at bedtime as needed for abdominal cramps and diarrhea  Further recommendations to follow pending review of pathology report  At patient request,   called tab at [phone] -discussed findings and recommendations

## 2020-11-12 NOTE — Interval H&P Note (Signed)
History and Physical Interval Note:  11/12/2020 12:38 PM  Dorette Grate  has presented today for surgery, with the diagnosis of abd pain, weight loss,diarrhea.  The various methods of treatment have been discussed with the patient and family. After consideration of risks, benefits and other options for treatment, the patient has consented to  Procedure(s) with comments: COLONOSCOPY WITH PROPOFOL (N/A) - 1:00pm as a surgical intervention.  The patient's history has been reviewed, patient examined, no change in status, stable for surgery.  I have reviewed the patient's chart and labs.  Questions were answered to the patient's satisfaction.     Zavannah Deblois  No change.  Patient with intermittent postprandial abdominal pain and nonbloody diarrhea.  GI pathogen panel positive for Sappo virus last year.  CT negative.  Ileocolonoscopy today per plan. The risks, benefits, limitations, alternatives and imponderables have been reviewed with the patient. Questions have been answered. All parties are agreeable.

## 2020-11-15 LAB — GLIA (IGA/G) + TTG IGA
Antigliadin Abs, IgA: 6 units (ref 0–19)
Gliadin IgG: 2 units (ref 0–19)
Tissue Transglutaminase Ab, IgA: <2 U/mL (ref 0–3)

## 2020-11-15 LAB — SURGICAL PATHOLOGY

## 2020-11-17 ENCOUNTER — Encounter (HOSPITAL_COMMUNITY): Payer: Self-pay | Admitting: Internal Medicine

## 2020-11-23 ENCOUNTER — Telehealth: Payer: Self-pay | Admitting: Internal Medicine

## 2020-11-23 NOTE — Telephone Encounter (Signed)
I called patient to get a progress report and recap his work-up thus far.  I reviewed colonoscopy findings which revealed no sign of inflammatory bowel disease.  Celiac screen negative.  Patient says since his colonoscopy he has felt "great".  Feels something has been "reset".  He is eating well.  He is having normal bowel function.  No longer taking Bentyl.  Has not taken a single sublingual Levsin. He is continuing to take a daily probiotic. It is my impression that the diagnosis of irritable bowel syndrome has been reaffirmed with recent exacerbation in setting of a self-limiting gastroenteritis. He is encouraged to keep his follow-up appointment with [place] next month.

## 2020-11-23 NOTE — Telephone Encounter (Signed)
done

## 2020-11-24 ENCOUNTER — Telehealth: Payer: Self-pay | Admitting: Nurse Practitioner

## 2020-11-24 DIAGNOSIS — R634 Abnormal weight loss: Secondary | ICD-10-CM

## 2020-11-24 DIAGNOSIS — R109 Unspecified abdominal pain: Secondary | ICD-10-CM

## 2020-11-24 DIAGNOSIS — R197 Diarrhea, unspecified: Secondary | ICD-10-CM

## 2020-11-24 NOTE — Telephone Encounter (Signed)
Spoke with pt. Pt discussed results yesterday with Dr. Jena Gauss. Pt is aware that additional labs are requested at this time. Pt prefers to wait until his apt.

## 2020-11-24 NOTE — Telephone Encounter (Signed)
There was a question about possible missing test for celiac panel. Appears verbal order taken by staff in Endoscopy entered as a celiac panel (tTG IgA and Glia IgA/IgM). Dr. Jena Gauss requesting total IgA to be ordered. Order entered for total IgA. Please inform patient that additional lab needs to be drawn. Thanks!

## 2020-11-26 NOTE — Telephone Encounter (Signed)
Noted  

## 2020-11-26 NOTE — Telephone Encounter (Signed)
Excellent, I'm glad he's feeling better!

## 2021-01-20 ENCOUNTER — Ambulatory Visit: Payer: BC Managed Care – PPO | Admitting: Nurse Practitioner

## 2021-02-24 NOTE — Progress Notes (Signed)
Referring Provider: Carylon Perches, MD Primary Care Physician:  Carylon Perches, MD Primary GI Physician: Dr. Jena Gauss  Chief Complaint  Patient presents with  . Diarrhea    IBS    HPI:   Peter Kirby is a 30 y.o. male presenting today for follow-up of abdominal pain, diarrhea, and weight loss.  Last seen in our office 10/21/2020 at the time of initial consult for the same. Per office note, prior evaluation by primary care with GI pathogen panel in November 2021 which was negative.  C. difficile was also included on GI pathogen panel and negative.  At the time of his visit, he reported chronic history of IBS waxing and waning in severity.  Symptoms were worse in grade school, improved in college, but began having recurrent symptoms starting in September/October with watery stools that seemed a bit different than his typical IBS symptoms.  Reported completing a test that showed he had a "foodborne illness".  Clear/watery stools improved, but he continued with 3-4 loose diarrhea stools daily.  He consumed a significant amount of fiber and low-fat.  Associated significant lower abdominal pain with bowel movements.  No hematochezia or melena.  Unintentional 10 pound weight loss over 3 months.  He was scheduled for CT A/P, colonoscopy, prescribed Bentyl 10 mg 3 times daily as needed, align daily.  CT A/P 11/08/2020: No acute findings.  Small bilateral renal cysts.  Colonoscopy 11/12/2020: Normal examined colon, normal-appearing TI, s/p sigmoid biopsy (benign).  No evidence of IBD.  Dr. Jena Gauss stated gastrointestinal pathology panel positive for Sapovirus last year and suspected postinfectious IBS.  Also recommended celiac screen prior to making firm diagnosis of IBS. Recommended Levsin 0.125 mg before meals and at bedtime as needed.  Celiac panel was negative.  Dr. Jena Gauss recommended completing IgA total which was not included.  This has not been completed.  Telephone note 11/23/2020: Patient stated he was  feeling great after his colonoscopy.  He was no longer taking Bentyl and had not taken a single Levsin.  He continue taking a daily probiotic.  Today: Symptoms are not as bad as they had been but have returned. Stools range from none to 5-6 per day. Can skip a couple days without a BM. Nerves and stress seem to bring on diarrhea. 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 States yesterday he had a good day and was feeling well until he had to host a dinner with 20 people. On his way to this dinner, he developed abdominal pain and felt he had to go to the bathroom. Has rarely taken Levsin. Doesn't carry with him. States it is inconvenient. No dietary triggers. Eats the same thing every day. Pretty much chicken, rice, and green beans.  Continues with Probiotic which he feels helps. Asking if seeing someone to talk about anxiety would be helpful. States he will not be medicated for anxiety, but is willing to talk to someone. This is offered through his job.   No brbpr or melena. Weight has stabilized. Gained 3 lbs since last visit. No significant upper GI symptoms.   Doesn't want to complete IgA testing.   Past Medical History:  Diagnosis Date  . Asthma    resolved; childhood exercise induced  . IBS (irritable bowel syndrome)     Past Surgical History:  Procedure Laterality Date  . BIOPSY  11/12/2020   Procedure: BIOPSY;  Surgeon: Corbin Ade, MD;  [facility];  Service: Endoscopy;;  .  COLONOSCOPY WITH PROPOFOL N/A 11/12/2020   Surgeon: Corbin Ade, MD; normal examined colon, normal-appearing TI, s/p sigmoid biopsy (benign)    Current Outpatient Medications  Medication Sig Dispense Refill  . hyoscyamine (LEVSIN SL) 0.125 MG SL tablet Place under the tongue as needed.    . Probiotic Product (PROBIOTIC PO) Take 1 capsule by mouth daily.     No current facility-administered medications for this visit.    Allergies as of 02/25/2021 - Review Complete  02/25/2021  Allergen Reaction Noted  . Bee venom Hives 08/20/2013    Family History  Problem Relation Age of Onset  . Diabetes Father   . Diabetes Maternal Grandfather   . COPD Paternal Grandmother   . Diabetes Paternal Grandfather   . Colon cancer Neg Hx   . Gastric cancer Neg Hx   . Esophageal cancer Neg Hx   . Inflammatory bowel disease Neg Hx     Social History   Socioeconomic History  . Marital status: Single    Spouse name: Not on file  . Number of children: Not on file  . Years of education: Not on file  . Highest education level: Not on file  Occupational History  . Not on file  Tobacco Use  . Smoking status: Never Smoker  . Smokeless tobacco: Never Used  Vaping Use  . Vaping Use: Never used  Substance and Sexual Activity  . Alcohol use: Yes    Comment: social on weekend typically 2-4 drinks  . Drug use: Never  . Sexual activity: Not on file  Other Topics Concern  . Not on file  Social History Narrative  . Not on file   Social Determinants of Health   Financial Resource Strain: Not on file  Food Insecurity: Not on file  Transportation Needs: Not on file  Physical Activity: Not on file  Stress: Not on file  Social Connections: Not on file    Review of Systems: Gen: Denies fever, chills, cold or flulike symptoms, presyncope, syncope. CV: Denies chest pain or palpitations. Resp: Denies dyspnea or cough. GI: See HPI Heme: See HPI  Physical Exam: BP 113/72   Pulse 72   Temp (!) 97.3 F (36.3 C) (Temporal)   Ht 5\' 9"  (1.753 m)   Wt 162 lb 3.2 oz (73.6 kg)   BMI 23.95 kg/m  General:   Alert and oriented. No distress noted. Pleasant and cooperative.  Head:  Normocephalic and atraumatic. Eyes:  Conjuctiva clear without scleral icterus. Heart:  S1, S2 present without murmurs appreciated. Lungs:  Clear to auscultation bilaterally. No wheezes, rales, or rhonchi. No distress.  Abdomen:  +BS, soft, non-tender and non-distended. No rebound or  guarding. No HSM or masses noted. Msk:  Symmetrical without gross deformities. Normal posture. Extremities:  Without edema. Neurologic:  Alert and  oriented x4 Psych:  Normal mood and affect.  Assessment: 30 year old male presenting today for follow-up of abdominal pain, diarrhea, and weight loss. Patient had reported chronic history of IBS with symptoms waxing and waning in severity over the years.  He had flare of symptoms in September/October 2021 when he reported being diagnosed with a "foodborne illness".  Per prior office visit note, patient had GI pathogen panel completed with PCP in November 2021 that was negative. He underwent additional evaluation to rule out other etiologies.  CT A/P 11/08/2020 with no acute findings.  Colonoscopy February 2022 with normal examined colon, normal-appearing TI, s/p sigmoid biopsy which was benign. Per Dr. March 2022 colonoscopy note, patient tested  positive positive for Sapovirus last year and suspected postinfectious IBS.  Completed celiac panel which was negative (without IgA total-patient now declining) . Clinically, he is doing fairly well. Continues with intermittent diarrhea which seems to be primarily triggered by stress/anxiety.  No food triggers.  No alarm symptoms.  Weight has stabilized and he has actually gained 3 pounds since January 2022.    Suspect we are dealing with IBS-D. He is rarely using Levsin, which I feel could be utilized more effectively to allow for better quality of life. May also benefit from seeing someone about his anxiety; the services are offered through his job.  Plan:  1.  Continue to use Levsin as needed for diarrhea and abdominal pain.  Recommended taking in the morning when he knows he will have a particularly stressful day or a large event occurring that causes anxiety for him to see if this will help prevent symptom onset. May take up to 4x/day.  Hold in the setting of constipation.  2.  Recommended he pursue talking with  someone about his anxiety.  Patient states he has the services offered through work and will let me know if he ends up needing a referral.  3.  Continue Probiotic.   4.  Follow-up in 6 months or sooner if needed.    Ermalinda Memos, PA-C [facility] 02/25/2021

## 2021-02-25 ENCOUNTER — Ambulatory Visit (INDEPENDENT_AMBULATORY_CARE_PROVIDER_SITE_OTHER): Payer: BC Managed Care – PPO | Admitting: Gastroenterology

## 2021-02-25 ENCOUNTER — Encounter: Payer: Self-pay | Admitting: Gastroenterology

## 2021-02-25 VITALS — BP 113/72 | HR 72 | Temp 97.3°F | Ht 69.0 in | Wt 162.2 lb

## 2021-02-25 DIAGNOSIS — R197 Diarrhea, unspecified: Secondary | ICD-10-CM

## 2021-02-25 DIAGNOSIS — R109 Unspecified abdominal pain: Secondary | ICD-10-CM | POA: Diagnosis not present

## 2021-02-25 DIAGNOSIS — R634 Abnormal weight loss: Secondary | ICD-10-CM

## 2021-02-25 NOTE — Patient Instructions (Signed)
I suspect your intermittent diarrhea and abdominal pain are secondary to IBS.   As your symptoms seem to be primarily influenced by stressors, I recommend you try taking Levsin in the morning on days you know there are events, etc that you know will bring on stress/anxiety.   Otherwise, you can use Levsin as needed for breakthrough diarrhea and abdominal pain. As we discussed, it may be that you need to carry this medication with you to use as needed.   I also think it is a good idea to talk to someone about your underlying anxiety as we discussed. Please let me know if you need a referral.   We will plan to follow-up in 6 months or sooner if needed.   It was good to meet you today!  Ermalinda Memos, PA-C [facility]

## 2021-06-07 DIAGNOSIS — E785 Hyperlipidemia, unspecified: Secondary | ICD-10-CM | POA: Diagnosis not present

## 2021-06-07 DIAGNOSIS — F909 Attention-deficit hyperactivity disorder, unspecified type: Secondary | ICD-10-CM | POA: Diagnosis not present

## 2021-06-07 DIAGNOSIS — K589 Irritable bowel syndrome without diarrhea: Secondary | ICD-10-CM | POA: Diagnosis not present

## 2021-06-07 DIAGNOSIS — Z833 Family history of diabetes mellitus: Secondary | ICD-10-CM | POA: Diagnosis not present

## 2021-06-27 DIAGNOSIS — Z0000 Encounter for general adult medical examination without abnormal findings: Secondary | ICD-10-CM | POA: Diagnosis not present

## 2021-08-27 NOTE — Progress Notes (Signed)
Referring Provider: Asencion Noble, MD Primary Care Physician:  Asencion Noble, MD Primary GI Physician: Dr. Gala Romney  Chief Complaint  Patient presents with   Abdominal Pain    Lower abd   Diarrhea    Weekly    HPI:   Peter Kirby is a 30 y.o. male presenting today for follow-up. Patient has reported history of chronic IBS-D since grade school. Symptoms of intermittent diarrhea with associated lower abdominal pain.  Noted 10 lb unintentional weight loss in the later part of 2021. Also with apparent Sapovirus at that time (per review of prior notes). CT A/P 11/08/2020: No acute findings.  Small bilateral renal cysts. Colonoscopy 11/12/2020: Normal examined colon, normal-appearing TI, s/p sigmoid biopsy (benign).  No evidence of IBD. Celiac panel negative (Iga not completed, patient declined).   Last seen in our office 02/25/21.  Reported his diarrhea/abdominal pain was not as bad as it used to be, but still present.  Stools range from 0-5-6/day.  Nerves and stress seem to be the primary trigger of diarrhea.  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  No identified dietary triggers. Eats the same thing every day-chicken, rice, green beans. Could skip a couple days between bowel movements.  Rarely taking Levsin.  Continued with daily probiotic which he felt was helpful.  Weight had stabilized. He was interested in seeing someone about anxiety and planned to see this through his job.  Recommended utilizing Levsin when he knows he will have a particularly stressful day for large event, may take up to 4 times per day, continue probiotic.  Also encouraged talking with someone about his anxiety which he plan to pursue through work.  Recommended completing IgA total, but patient declined.  Follow-up in 6 months or sooner if needed.  Today: Continues with intermittent diarrhea and lower abdominal pain, taking Levsin and a probiotic every morning.  Some days, he is fine, other days are  "hell".  On days it is bad, he will have 5-6 BMs per day with quite a bit of abdominal pain prior to a bowel movement that improves thereafter.  This occurs about once every 2 weeks.  Otherwise, he has 1-2 BMs per day.  Occasional cramping prior to bowel movements, but nothing significant on these days.  Last flare was during Thanksgiving.  States he ate very normal at that time.  No identified food triggers.  No trouble with milk or cheese.  Denies BRBPR or melena.  His weight is stable.  Hasn't talked with anyone yet about his anxiety.  States he does not feel the resources through his work would be very effective as it is just over a phone.  He would like to see someone in person.  Past Medical History:  Diagnosis Date   Asthma    resolved; childhood exercise induced   IBS (irritable bowel syndrome)     Past Surgical History:  Procedure Laterality Date   BIOPSY  11/12/2020   Procedure: BIOPSY;  Surgeon: Daneil Dolin, MD;  [facility];  Service: Endoscopy;;   COLONOSCOPY WITH PROPOFOL N/A 11/12/2020   Surgeon: Daneil Dolin, MD; normal examined colon, normal-appearing TI, s/p sigmoid biopsy (benign)    Current Outpatient Medications  Medication Sig Dispense Refill   hyoscyamine (LEVSIN SL) 0.125 MG SL tablet Place under the tongue as needed.     Probiotic Product (PROBIOTIC PO) Take 1 capsule by mouth daily.     rifaximin (XIFAXAN) 550 MG  TABS tablet Take 1 tablet (550 mg total) by mouth 3 (three) times daily. 42 tablet 0   No current facility-administered medications for this visit.    Allergies as of 08/29/2021 - Review Complete 08/29/2021  Allergen Reaction Noted   Bee venom Hives 08/20/2013    Family History  Problem Relation Age of Onset   Diabetes Father    Diabetes Maternal Grandfather    COPD Paternal Grandmother    Diabetes Paternal Grandfather    Colon cancer Neg Hx    Gastric cancer Neg Hx    Esophageal cancer Neg Hx    Inflammatory bowel disease  Neg Hx     Social History   Socioeconomic History   Marital status: Single    Spouse name: Not on file   Number of children: Not on file   Years of education: Not on file   Highest education level: Not on file  Occupational History   Not on file  Tobacco Use   Smoking status: Never   Smokeless tobacco: Never  Vaping Use   Vaping Use: Never used  Substance and Sexual Activity   Alcohol use: Yes    Comment: social on weekend typically 2-4 drinks   Drug use: Never   Sexual activity: Not on file  Other Topics Concern   Not on file  Social History Narrative   Not on file   Social Determinants of Health   Financial Resource Strain: Not on file  Food Insecurity: Not on file  Transportation Needs: Not on file  Physical Activity: Not on file  Stress: Not on file  Social Connections: Not on file    Review of Systems: Gen: Denies fever, chills, cold or flulike symptoms, presyncope, syncope. CV: Denies chest pain, palpitations. Resp: Denies dyspnea or cough. GI: See HPI Heme: See HPI  Physical Exam: BP 117/74   Pulse 74   Temp (!) 97.3 F (36.3 C)   Ht 5\' 9"  (1.753 m)   Wt 163 lb 3.2 oz (74 kg)   BMI 24.10 kg/m  General:   Alert and oriented. No distress noted. Pleasant and cooperative.  Head:  Normocephalic and atraumatic. Eyes:  Conjuctiva clear without scleral icterus. Heart:  S1, S2 present without murmurs appreciated. Lungs:  Clear to auscultation bilaterally. No wheezes, rales, or rhonchi. No distress.  Abdomen:  +BS, soft, non-tender and non-distended. No rebound or guarding. No HSM or masses noted. Msk:  Symmetrical without gross deformities. Normal posture. Extremities:  Without edema. Neurologic:  Alert and  oriented x4 Psych:  Normal mood and affect.    Assessment: 30 year old male with history of chronic IBS-D since grade school, also with history of Sapovirus in the latter part of 2021, presenting today for routine follow-up.  He continues with  intermittent diarrhea with associated lower abdominal pain that improves after a bowel movement.  Currently taking Levsin once daily along with a probiotic.  Typically with 1-2 BMs per day, but intermittent flares once every couple of weeks with up to 5-6 BMs per day and increased abdominal cramping, often triggered by stress.  On these days, he does not take any additional Levsin as he feels it is inconvenient to carry this around.  He has no alarm symptoms.  Prior CT A/P in February 2022 with no acute findings.  Colonoscopy in February 2022 with a normal exam, colon biopsy benign.  Celiac panel previously negative, but IgA total not completed, and patient declined.   As patient prefers to take as little medication  as possible and feels as needed medications are inconvenient, we will try him on a 14-day course of Xifaxan.  If ongoing symptoms, could consider trial of Viberzi.  Gallbladder remains in situ.  We also discussed his mild anxiety. Encouraged he discuss with PCP. He is interested in seeing someone, but does not want to take any sort of medications for this.  Plan:  Xifaxan 550 mg 3 times daily x14 days.  Prescription sent to pharmacy.  Requested patient let me know if he has any trouble obtaining this medication. Continue Levsin until starting Xifaxan. Requested progress report after Xifaxan completion. Discussed anxiety with PCP. Follow-up in 6 months or sooner if needed.   Aliene Altes, PA-C [facility] 08/29/2021

## 2021-08-29 ENCOUNTER — Other Ambulatory Visit: Payer: Self-pay

## 2021-08-29 ENCOUNTER — Ambulatory Visit (INDEPENDENT_AMBULATORY_CARE_PROVIDER_SITE_OTHER): Payer: BC Managed Care – PPO | Admitting: Gastroenterology

## 2021-08-29 ENCOUNTER — Encounter: Payer: Self-pay | Admitting: Gastroenterology

## 2021-08-29 VITALS — BP 117/74 | HR 74 | Temp 97.3°F | Ht 69.0 in | Wt 163.2 lb

## 2021-08-29 DIAGNOSIS — K580 Irritable bowel syndrome with diarrhea: Secondary | ICD-10-CM

## 2021-08-29 DIAGNOSIS — F419 Anxiety disorder, unspecified: Secondary | ICD-10-CM | POA: Insufficient documentation

## 2021-08-29 HISTORY — DX: Anxiety disorder, unspecified: F41.9

## 2021-08-29 MED ORDER — RIFAXIMIN 550 MG PO TABS: 550.0000 mg | ORAL_TABLET | Freq: Three times a day (TID) | ORAL | 0 refills | Status: DC

## 2021-08-29 NOTE — Patient Instructions (Signed)
Start Xifaxan 550 mg 3 times daily for 14 days for irritable bowel syndrome. I have sent a prescription to your pharmacy. Please let me know if you have any trouble obtaining this medication.   After you complete your course of Xifaxan, please let me know how you are doing.  You may continue using Levsin daily up to 4 times per day until you start Xifaxan.   We will plan to follow-up with you in about 6 months or sooner if needed.   Ermalinda Memos, PA-C [facility]

## 2021-08-31 ENCOUNTER — Telehealth: Payer: Self-pay | Admitting: Internal Medicine

## 2021-08-31 NOTE — Telephone Encounter (Signed)
Toni Amend, can you find out if a PA was required for this or if insurance just isn't going to cover it?

## 2021-08-31 NOTE — Telephone Encounter (Signed)
Spoke to pharmacy the medication is [phone] with insurance, no PA is required.

## 2021-08-31 NOTE — Telephone Encounter (Signed)
PATIENT CALLED AND SAID THAT THE XIAFAXIN WAS VERY EXPENSIVE AND HE NEEDS TO TRY SOMETHING DIFFERENT

## 2021-09-01 NOTE — Telephone Encounter (Signed)
Noted  

## 2021-09-01 NOTE — Telephone Encounter (Signed)
Spoke with patient. Discussed the possibly starting Viberzi. States he will think about it and will likely talk it over with Dr. Jena Gauss over the weekend. He will call back with his decision.

## 2021-09-29 ENCOUNTER — Other Ambulatory Visit: Payer: Self-pay | Admitting: Gastroenterology

## 2021-09-29 ENCOUNTER — Telehealth: Payer: Self-pay | Admitting: Internal Medicine

## 2021-09-29 DIAGNOSIS — K580 Irritable bowel syndrome with diarrhea: Secondary | ICD-10-CM

## 2021-09-29 MED ORDER — VIBERZI 75 MG PO TABS: 75.0000 mg | ORAL_TABLET | Freq: Two times a day (BID) | ORAL | 3 refills | Status: DC

## 2021-09-29 NOTE — Telephone Encounter (Signed)
Pt would like to start taking Xifaxan. He stated it is working well for him

## 2021-09-29 NOTE — Telephone Encounter (Signed)
I don't think he is referring to Xifaxan. This is an antibiotic that works in the GI tract that is prescribed for a 14 day course. We submitted for this previously and patient stated it was too expensive. We last spoke about starting him on Viberzi once daily, is this what he is referring to?

## 2021-09-29 NOTE — Telephone Encounter (Signed)
Noted he could not remember the name.

## 2021-09-29 NOTE — Telephone Encounter (Signed)
Noted.  Peter Kirby, can we get him scheduled for a follow-up in 3 months?

## 2021-09-29 NOTE — Telephone Encounter (Signed)
AT LAST VISIT Peter Kirby DISCUSSED MEDICATIONS WITH PATIENT AND HE WOULD LIKE TO HAVE IT SENT TO HIS PHARMACY NOW.  WANTS TO START TAKING IT

## 2021-09-29 NOTE — Telephone Encounter (Signed)
Spoke to pt. Informed him that I faxed a prescription to [facility] for Viberzi and not to drink more than 3 alcoholic beverages daily. Pt voiced understanding

## 2021-09-29 NOTE — Telephone Encounter (Addendum)
Please let patient know I will send Viberzi 75 mg to his pharmacy. He will take 1 tablet twice daily with meals.   Please also let him know not to consume more than 3 alcoholic beverages per day due to risk of acute pancreatitis.

## 2021-09-30 ENCOUNTER — Telehealth: Payer: Self-pay | Admitting: Internal Medicine

## 2021-09-30 NOTE — Telephone Encounter (Signed)
Communication noted.  

## 2021-09-30 NOTE — Telephone Encounter (Signed)
Patient requested I call him to discuss his condition.  I called him last night.  Lengthy discussion about IBS and its management.  Patient was not able to get Xifaxan previously prescribed as out-of-pocket expense would be $4000.  More recently patient prescribed Eluxadoline.  Prescription called in but he has not picked it up.  Unsure of out-of-pocket.  Had concerns about taking his medications.  Discussed side effects.  He was concerned about pancreatitis.  There is no contraindication for this medication in this patient.  In fact, I think it is the next reasonable step in management from a pharmacological perspective.  Gall bladder remains in situ.  Alcohol use is minimal.  I told him and his worth trying if he can be afforded.  We can discuss other options as well depending on progress report with his new medication.  Emphasized lifestyle treatment for IBS.  Agree with regular aerobic exercise.  Healthy diet, stress management , etc.  Patient will update [place] with a progress report in the near future.

## 2021-10-04 ENCOUNTER — Telehealth: Payer: Self-pay | Admitting: Internal Medicine

## 2021-10-04 DIAGNOSIS — K580 Irritable bowel syndrome with diarrhea: Secondary | ICD-10-CM

## 2021-10-04 MED ORDER — HYOSCYAMINE SULFATE 0.125 MG SL SUBL: 0.1250 mg | SUBLINGUAL_TABLET | Freq: Four times a day (QID) | SUBLINGUAL | 1 refills | Status: DC | PRN

## 2021-10-04 NOTE — Telephone Encounter (Signed)
Peter Kirby, can you find out if a prior authorization is needed for Viberzi?   I will refill his Levsin for now. He can take this up to 4 times daily for abdominal cramping or loose stools.   If we are able to obtain Viberzi, he should discontinue Levsin once he starts Viberzi.

## 2021-10-04 NOTE — Telephone Encounter (Signed)
[employer], they stated no prior authorization was needed for Viberzi, but was 1500$.

## 2021-10-04 NOTE — Telephone Encounter (Signed)
Pt called to say the 2 prescriptions given to him where too expensive (viberzi and xifaxin) and was asking if he could get a new prescription of what he was taking previously. He uses [facility].

## 2021-10-05 NOTE — Telephone Encounter (Signed)
LMOM of pt to call office back

## 2021-10-05 NOTE — Telephone Encounter (Signed)
Noted. As patient has [employer], he can use the Viberzi savings card where he can potentially pay as little as $30 for a 30 days or $30 for 90 days. Please provide him with this savings card and have him let us know if he is able to obtain Viberzi.

## 2021-10-05 NOTE — Telephone Encounter (Signed)
Spoke to pt, informed him that there was a discount card for [employer]. Offered to mail to him. He said he would come by office and pick card up. Left discount card at front desk.

## 2021-10-05 NOTE — Telephone Encounter (Signed)
Noted  

## 2021-10-07 ENCOUNTER — Telehealth: Payer: Self-pay | Admitting: *Deleted

## 2021-10-07 DIAGNOSIS — K580 Irritable bowel syndrome with diarrhea: Secondary | ICD-10-CM

## 2021-10-07 MED ORDER — VIBERZI 75 MG PO TABS: 75.0000 mg | ORAL_TABLET | Freq: Two times a day (BID) | ORAL | 3 refills | Status: DC

## 2021-10-07 NOTE — Telephone Encounter (Signed)
Pt called and wants to know if a 90 day supply of Viberzi can be sent to pharmacy. He did use the discount card and said thank you.

## 2021-10-07 NOTE — Telephone Encounter (Signed)
RX completed per Kristen's request.

## 2021-10-07 NOTE — Telephone Encounter (Signed)
Noted  

## 2021-10-07 NOTE — Addendum Note (Signed)
Addended by: Tiffany Kocher on: 10/07/2021 11:01 AM   Modules accepted: Orders

## 2022-01-09 ENCOUNTER — Encounter: Payer: Self-pay | Admitting: Internal Medicine

## 2022-02-14 ENCOUNTER — Encounter: Payer: Self-pay | Admitting: Internal Medicine

## 2022-02-14 ENCOUNTER — Ambulatory Visit (INDEPENDENT_AMBULATORY_CARE_PROVIDER_SITE_OTHER): Payer: BC Managed Care – PPO | Admitting: Internal Medicine

## 2022-02-14 VITALS — BP 118/78 | HR 84 | Temp 98.3°F | Ht 69.0 in | Wt 165.2 lb

## 2022-02-14 DIAGNOSIS — K580 Irritable bowel syndrome with diarrhea: Secondary | ICD-10-CM

## 2022-02-14 NOTE — Progress Notes (Unsigned)
Primary Care Physician:  Carylon Perches, MD Primary Gastroenterologist:  Dr. Jena Gauss  Pre-Procedure History & Physical: HPI:  Peter Kirby is a 31 y.o. male here for follow-up of IBS D.  Now on Viberzi 75 mg daily.  He has had a remarkably good response.  Might have a little diarrhea 1 day out of 30;  otherwise symptoms of IBS-D are quiescent.  He is very happy.  No significant alcohol consumption.  Gallbladder in situ.  Appetite is good.  He is gained 2 pounds since his last office visit.  Past Medical History:  Diagnosis Date   Asthma    resolved; childhood exercise induced   IBS (irritable bowel syndrome)     Past Surgical History:  Procedure Laterality Date   BIOPSY  11/12/2020   Procedure: BIOPSY;  Surgeon: Corbin Ade, MD;  Location: [facility];  Service: Endoscopy;;   COLONOSCOPY WITH PROPOFOL N/A 11/12/2020   Surgeon: Corbin Ade, MD; normal examined colon, normal-appearing TI, s/p sigmoid biopsy (benign)    Prior to Admission medications   Medication Sig Start Date End Date Taking? Authorizing Provider  Eluxadoline (VIBERZI) 75 MG TABS Take 75 mg by mouth 2 (two) times daily. You should limit alcoholic drinks to no more than 2 drinks within 24 hours period while on Viberzi (due to risk of pancreatitis). 10/07/21  Yes Tiffany Kocher, PA-C  Probiotic Product (PROBIOTIC PO) Take 1 capsule by mouth daily.   Yes [provider]    Allergies as of 02/14/2022 - Review Complete 02/14/2022  Allergen Reaction Noted   Bee venom Hives 08/20/2013    Family History  Problem Relation Age of Onset   Diabetes Father    Diabetes Maternal Grandfather    COPD Paternal Grandmother    Diabetes Paternal Grandfather    Colon cancer Neg Hx    Gastric cancer Neg Hx    Esophageal cancer Neg Hx    Inflammatory bowel disease Neg Hx     Social History   Socioeconomic History   Marital status: Single    Spouse name: Not on file   Number of children: Not on file    Years of education: Not on file   Highest education level: Not on file  Occupational History   Not on file  Tobacco Use   Smoking status: Never   Smokeless tobacco: Never  Vaping Use   Vaping Use: Never used  Substance and Sexual Activity   Alcohol use: Yes    Comment: social on weekend typically 2-4 drinks   Drug use: Never   Sexual activity: Yes  Other Topics Concern   Not on file  Social History Narrative   Not on file   Social Determinants of Health   Financial Resource Strain: Not on file  Food Insecurity: Not on file  Transportation Needs: Not on file  Physical Activity: Not on file  Stress: Not on file  Social Connections: Not on file  Intimate Partner Violence: Not on file    Review of Systems: See HPI, otherwise negative ROS  Physical Exam: BP 118/78 (BP Location: Left Arm, Patient Position: Sitting, Cuff Size: Normal)   Pulse 84   Temp 98.3 F (36.8 C) (Oral)   Ht 5\' 9"  (1.753 m)   Wt 165 lb 3.2 oz (74.9 kg)   SpO2 97%   BMI 24.40 kg/m  General:   Alert,  Well-developed, well-nourished, pleasant and cooperative in NAD  Impression/Plan: Pleasant 31 year old gentleman with well-established IBS-diarrhea;  he has had a remarkably good response to Viberzi thus far.  We discussed the mechanism of action and the potential hazards with this medication including cholecystectomy, pancreatitis and alcohol consumption.  I specifically explained to him that his IBS has no been cured; however, it looks like he may be in a good remission.  Recommendations:  Continue Viberzi 75 mg daily.  New prescription and samples provided  We will plan to see him back in 6 months and as needed.       Notice: This dictation was prepared with Dragon dictation along with smaller phrase technology. Any transcriptional errors that result from this process are unintentional and may not be corrected upon review.

## 2022-02-14 NOTE — Patient Instructions (Signed)
It was good to see you again today!  Continue Viberzi 75 mg once daily.  New prescription-#90 with 3 additional refills plus samples provided  Office visit here in 6 months and as needed.

## 2022-02-15 ENCOUNTER — Other Ambulatory Visit: Payer: Self-pay

## 2022-02-15 DIAGNOSIS — K580 Irritable bowel syndrome with diarrhea: Secondary | ICD-10-CM

## 2022-02-15 MED ORDER — VIBERZI 75 MG PO TABS: 75.0000 mg | ORAL_TABLET | Freq: Two times a day (BID) | ORAL | 3 refills | Status: DC

## 2022-02-16 ENCOUNTER — Encounter: Payer: Self-pay | Admitting: Internal Medicine

## 2022-07-25 ENCOUNTER — Encounter: Payer: Self-pay | Admitting: *Deleted

## 2022-08-21 DIAGNOSIS — Z6823 Body mass index (BMI) 23.0-23.9, adult: Secondary | ICD-10-CM | POA: Diagnosis not present

## 2022-08-21 DIAGNOSIS — Z0000 Encounter for general adult medical examination without abnormal findings: Secondary | ICD-10-CM | POA: Diagnosis not present

## 2022-10-02 IMAGING — CT CT ABD-PELV W/ CM
2 of 4 series · 16 of 46 positions shown, 18 images · IV contrast (APPLIED)
Comparison: None.

CLINICAL DATA: Abdominal pain, unintended weight loss

EXAM:
CT ABDOMEN AND PELVIS WITH CONTRAST
TECHNIQUE: Multidetector CT imaging of the abdomen and pelvis was performed
using the standard protocol following bolus administration of
intravenous contrast.
CONTRAST:  100mL OMNIPAQUE IOHEXOL 300 MG/ML  SOLN

[Series 2: axial st · axial · 0.78mm/px · z∈[-522,-122]mm · 13 of 92 slices shown, 15 images]
[im 6/92  soft-tissue]
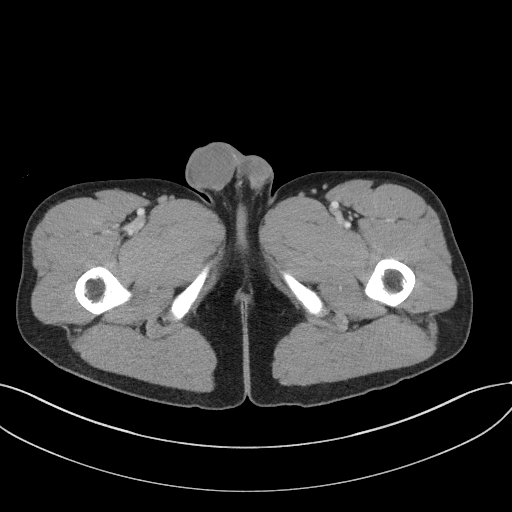
[im 6/92  bone]
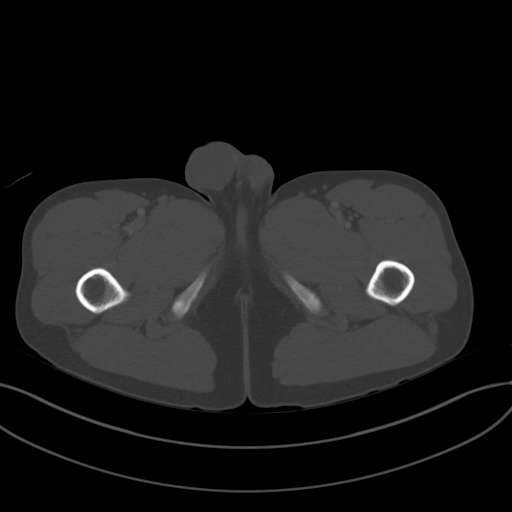
[im 11/92  soft-tissue]
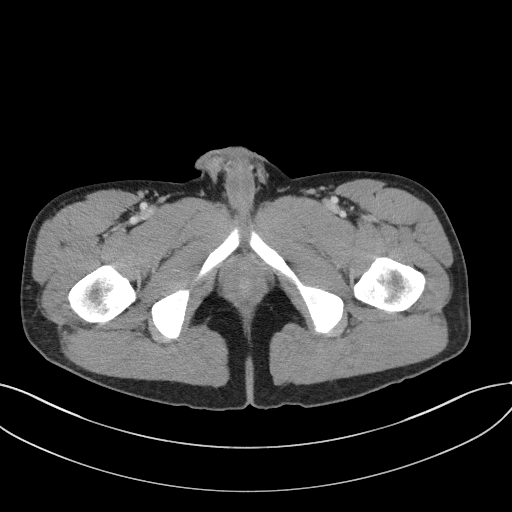
[im 22/92  soft-tissue]
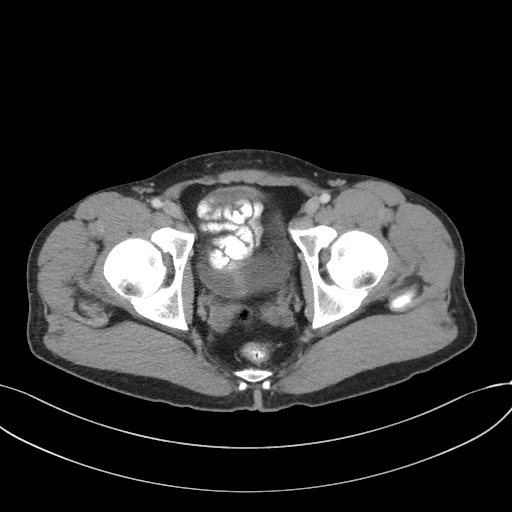
[im 27/92  soft-tissue]
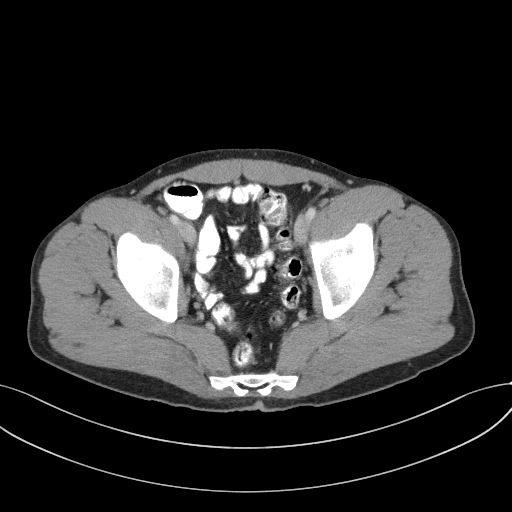
[im 33/92  soft-tissue]
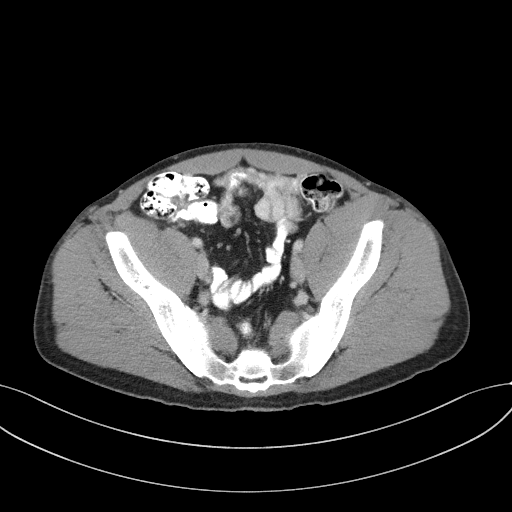
[im 38/92  soft-tissue]
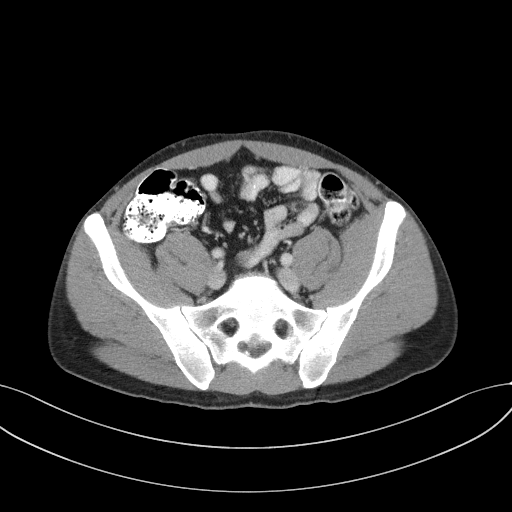
[im 49/92  soft-tissue]
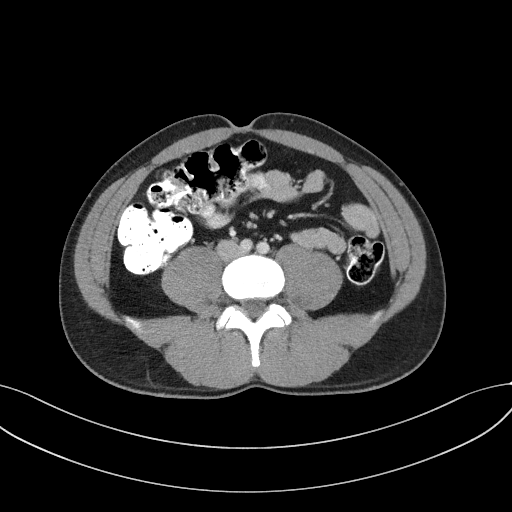
[im 54/92  soft-tissue]
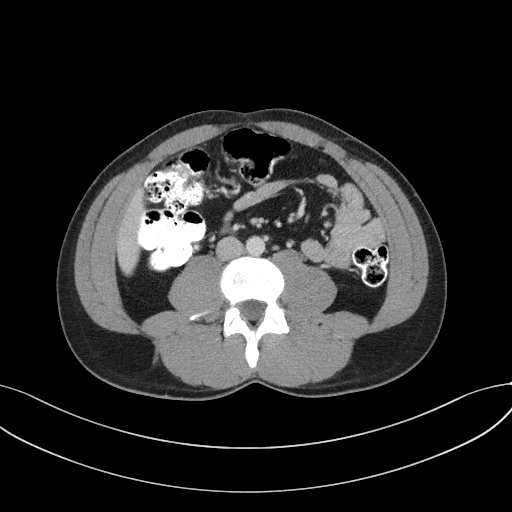
[im 59/92  soft-tissue]
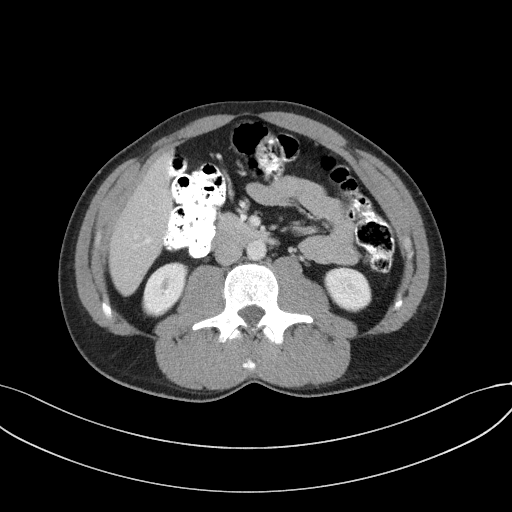
[im 59/92  bone]
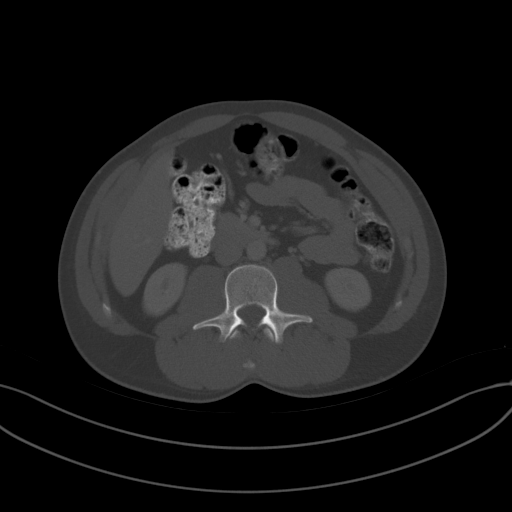
[im 65/92  soft-tissue]
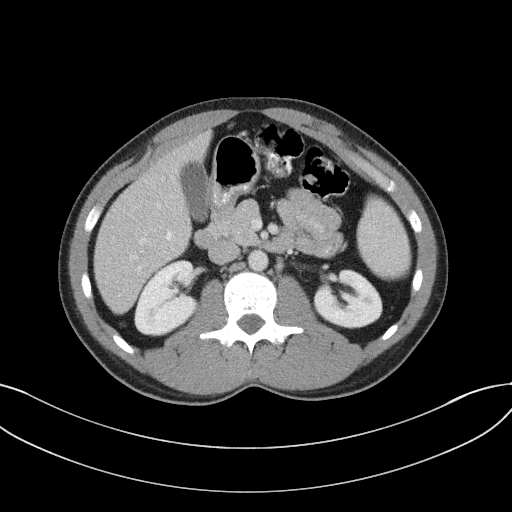
[im 70/92  soft-tissue]
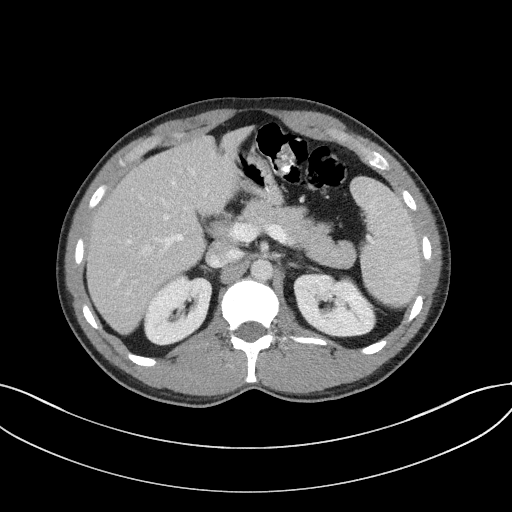
[im 81/92  soft-tissue]
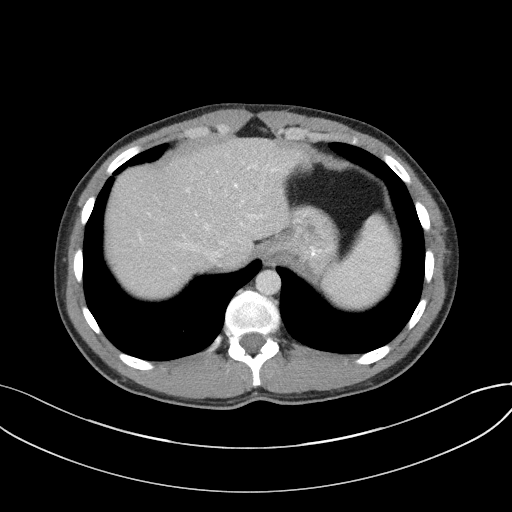
[im 86/92  soft-tissue]
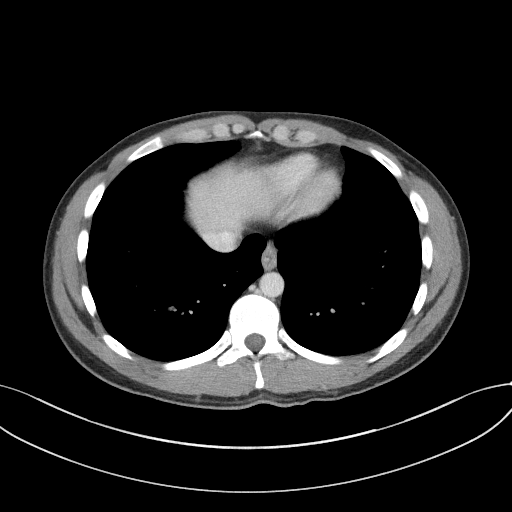

[Series 5: coronal st · coronal · 0.71mm/px · 3 of 86 slices shown]
[im 29/86  soft-tissue]
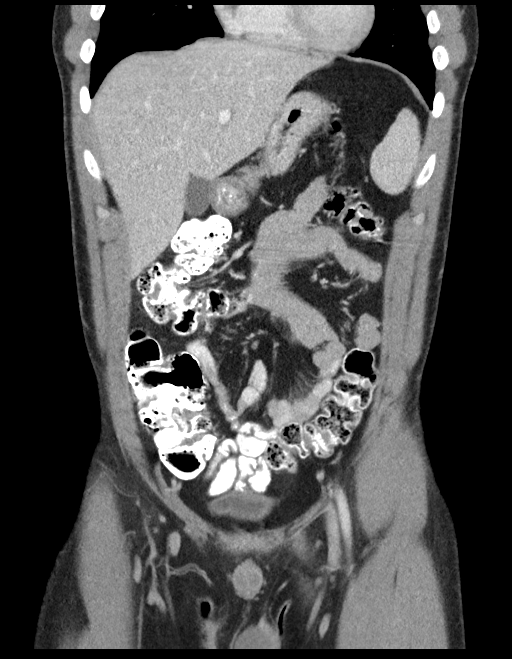
[im 38/86  soft-tissue]
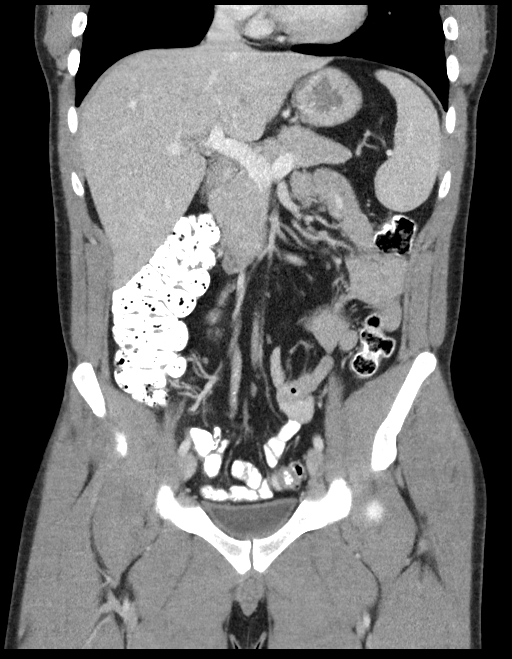
[im 48/86  soft-tissue]
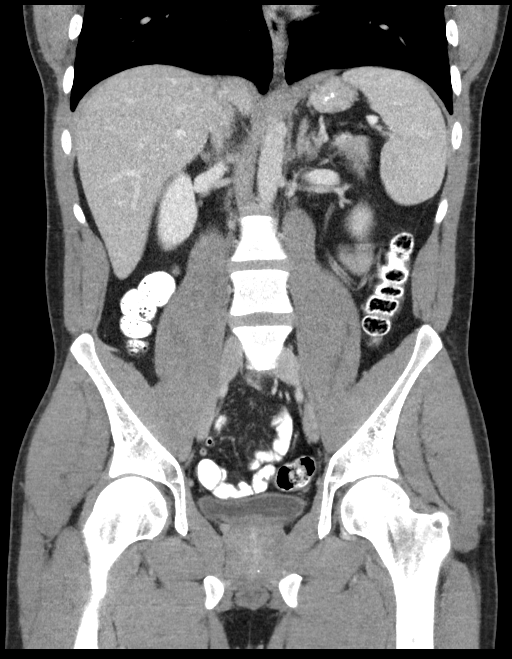

[16 of 46 positions shown; findings below may reference images not displayed]

FINDINGS: Lower chest: Lung bases are clear. No effusions. Heart is normal
size.

Hepatobiliary: No focal hepatic abnormality. Gallbladder
unremarkable.

Pancreas: No focal abnormality or ductal dilatation.

Spleen: No focal abnormality.  Normal size.

Adrenals/Urinary Tract: 1.8 cm cyst in the upper pole of the left
kidney. 9 mm cyst in the midpole of the right kidney. No
hydronephrosis. Adrenal glands and urinary bladder unremarkable.

Stomach/Bowel: Stomach, large and small bowel grossly unremarkable.
Appendix normal.

Vascular/Lymphatic: No evidence of aneurysm or adenopathy.

Reproductive: No visible focal abnormality.

Other: No free fluid or free air.

Musculoskeletal: No acute bony abnormality.
IMPRESSION: No acute findings in the abdomen or pelvis.

Small bilateral renal cysts.

## 2022-10-05 ENCOUNTER — Other Ambulatory Visit: Payer: Self-pay | Admitting: Internal Medicine

## 2022-10-05 DIAGNOSIS — K580 Irritable bowel syndrome with diarrhea: Secondary | ICD-10-CM

## 2023-04-27 ENCOUNTER — Other Ambulatory Visit: Payer: Self-pay | Admitting: Internal Medicine

## 2023-04-27 DIAGNOSIS — K580 Irritable bowel syndrome with diarrhea: Secondary | ICD-10-CM

## 2023-04-30 NOTE — Telephone Encounter (Signed)
I am refilling medication.  Patient needs to arrange follow-up with Dr. Jena Gauss.  Mandy, please arrange routine follow-up with Dr. Jena Gauss.

## 2023-05-02 NOTE — Telephone Encounter (Signed)
Great!

## 2023-05-11 ENCOUNTER — Encounter: Payer: Self-pay | Admitting: Internal Medicine

## 2023-05-11 ENCOUNTER — Ambulatory Visit: Payer: BC Managed Care – PPO | Admitting: Internal Medicine

## 2023-05-11 VITALS — BP 135/81 | HR 73 | Temp 97.7°F | Ht 69.0 in | Wt 164.2 lb

## 2023-05-11 DIAGNOSIS — K580 Irritable bowel syndrome with diarrhea: Secondary | ICD-10-CM

## 2023-05-11 NOTE — Progress Notes (Signed)
Primary Care Physician:  Carylon Perches, MD Primary Gastroenterologist:  Dr. Jena Gauss  Pre-Procedure History & Physical: HPI:  Peter Kirby is a 32 y.o. male here for follow-up of IBS-D.  He continues to enjoy an excellent response from [employer].  He has about  1 episode of abdominal cramps and diarrhea monthly with the remainder of the time remaining asymptomatic.  With no symptoms.  He is very active.  He does cite stress as a major symptom trigger.  He has seen a Veterinary surgeon.  He has not had any bleeding.  His alcohol consumption is very conservative.  His gallbladder remains in situ.  He stopped his probiotic as he did not feel it was making a difference.  Past Medical History:  Diagnosis Date   Asthma    resolved; childhood exercise induced   IBS (irritable bowel syndrome)     Past Surgical History:  Procedure Laterality Date   BIOPSY  11/12/2020   Procedure: BIOPSY;  Surgeon: Corbin Ade, MD;  Location: [facility];  Service: Endoscopy;;   COLONOSCOPY WITH PROPOFOL N/A 11/12/2020   Surgeon: Corbin Ade, MD; normal examined colon, normal-appearing TI, s/p sigmoid biopsy (benign)    Prior to Admission medications   Medication Sig Start Date End Date Taking? Authorizing Provider  Eluxadoline (VIBERZI) 75 MG TABS TAKE 1 TABLET (75 MG) BY MOUTH TWO TIMESDAILY.LIMIT ALCOHOLIC DRINKS TO NO MORE THAN 2 DRINKS WITHIN 24 HRS WHILE TAKINGDUE TO RISK OF PANCREATITIS. 04/30/23  Yes Ermalinda Memos S, PA-C    Allergies as of 05/11/2023 - Review Complete 05/11/2023  Allergen Reaction Noted   Bee venom Hives 08/20/2013    Family History  Problem Relation Age of Onset   Diabetes Father    Diabetes Maternal Grandfather    COPD Paternal Grandmother    Diabetes Paternal Grandfather    Colon cancer Neg Hx    Gastric cancer Neg Hx    Esophageal cancer Neg Hx    Inflammatory bowel disease Neg Hx     Social History   Socioeconomic History   Marital status: Single    Spouse  name: Not on file   Number of children: Not on file   Years of education: Not on file   Highest education level: Not on file  Occupational History   Not on file  Tobacco Use   Smoking status: Never   Smokeless tobacco: Never  Vaping Use   Vaping status: Never Used  Substance and Sexual Activity   Alcohol use: Yes    Comment: social on weekend typically 2-4 drinks   Drug use: Never   Sexual activity: Yes  Other Topics Concern   Not on file  Social History Narrative   Not on file   Social Determinants of Health   Financial Resource Strain: Not on file  Food Insecurity: Not on file  Transportation Needs: Not on file  Physical Activity: Not on file  Stress: Not on file  Social Connections: Not on file  Intimate Partner Violence: Not on file    Review of Systems: See HPI, otherwise negative ROS  Physical Exam: BP 135/81 (BP Location: Right Arm, Patient Position: Sitting, Cuff Size: Normal)   Pulse 73   Temp 97.7 F (36.5 C) (Oral)   Ht 5\' 9"  (1.753 m)   Wt 164 lb 3.2 oz (74.5 kg)   SpO2 98%   BMI 24.25 kg/m  General:   Alert,  Well-developed, well-nourished, pleasant and cooperative in NAD Abdomen: Non-distended, normal  bowel sounds.  Soft and nontender without appreciable mass or hepatosplenomegaly.   Impression/Plan: 32 year old gentleman with diarrhea predominant irritable bowel syndrome; he has had an excellent response to Viberzi.  His symptoms are well-controlled with rare exacerbations.  We had a discussion about the fact that irritable bowel syndrome is not a disease.  He can expect exacerbations throughout his life.  Stress is a major factor.  Also, some foods are triggers as he has were not.  Our goal is for him to have many more good days than bad days.  Recommendations:  Continue Viberzi.  Conservative alcohol consumption while on this medication.  Avoid known triggers  Plan to see  back in 1 year and as needed   Notice: This dictation was prepared  with Dragon dictation along with smaller phrase technology. Any transcriptional errors that result from this process are unintentional and may not be corrected upon review.

## 2023-05-11 NOTE — Patient Instructions (Addendum)
It was good to see you again today!  It appears your irritable bowel syndrome is under excellent control with Viberzi.  Your good days are far outnumbering your bad days.  That is our goal of treatment.  Conservative alcohol use while on Viberzi as discussed.  Avoid foods known to trigger symptoms.  Unless something comes up, we will plan to see you back in 1 year.

## 2023-10-23 DIAGNOSIS — F411 Generalized anxiety disorder: Secondary | ICD-10-CM | POA: Diagnosis not present

## 2023-11-12 DIAGNOSIS — F411 Generalized anxiety disorder: Secondary | ICD-10-CM | POA: Diagnosis not present

## 2023-12-04 DIAGNOSIS — F411 Generalized anxiety disorder: Secondary | ICD-10-CM | POA: Diagnosis not present

## 2023-12-05 ENCOUNTER — Ambulatory Visit: Payer: BC Managed Care – PPO | Admitting: Family

## 2023-12-05 ENCOUNTER — Other Ambulatory Visit: Payer: Self-pay | Admitting: Gastroenterology

## 2023-12-05 ENCOUNTER — Encounter: Payer: Self-pay | Admitting: Family

## 2023-12-05 ENCOUNTER — Ambulatory Visit (INDEPENDENT_AMBULATORY_CARE_PROVIDER_SITE_OTHER): Payer: BC Managed Care – PPO | Admitting: Family

## 2023-12-05 VITALS — BP 117/76 | HR 81 | Temp 97.7°F | Ht 69.0 in | Wt 160.2 lb

## 2023-12-05 DIAGNOSIS — F411 Generalized anxiety disorder: Secondary | ICD-10-CM | POA: Diagnosis not present

## 2023-12-05 DIAGNOSIS — F419 Anxiety disorder, unspecified: Secondary | ICD-10-CM | POA: Diagnosis not present

## 2023-12-05 DIAGNOSIS — Z1159 Encounter for screening for other viral diseases: Secondary | ICD-10-CM | POA: Diagnosis not present

## 2023-12-05 DIAGNOSIS — Z114 Encounter for screening for human immunodeficiency virus [HIV]: Secondary | ICD-10-CM

## 2023-12-05 DIAGNOSIS — Z0000 Encounter for general adult medical examination without abnormal findings: Secondary | ICD-10-CM

## 2023-12-05 DIAGNOSIS — K580 Irritable bowel syndrome with diarrhea: Secondary | ICD-10-CM

## 2023-12-05 MED ORDER — ESCITALOPRAM OXALATE 5 MG PO TABS: 5.0000 mg | ORAL_TABLET | Freq: Every day | ORAL | 2 refills | Status: DC

## 2023-12-05 MED ORDER — PROPRANOLOL HCL 10 MG PO TABS: 10.0000 mg | ORAL_TABLET | Freq: Three times a day (TID) | ORAL | 2 refills | Status: AC | PRN

## 2023-12-05 NOTE — Assessment & Plan Note (Signed)
 Daily anxiety with palpitations exacerbated by stress. Discussed propranolol for situational anxiety and Lexapro for daily management. Lexapro recommended for lower side effects and quicker onset. Propranolol suggested for acute episodes. - Prescribe Lexapro at the lowest dose for daily use. - Prescribe propranolol for situational use, advising him to try it on a weekend first to assess tolerance. - Encourage lifestyle modifications: regular exercise, yoga, meditation, reduce caffeine and sugar. - Schedule follow-up in one month to assess effectiveness and side effects.

## 2023-12-05 NOTE — Patient Instructions (Signed)
 Welcome to [facility], It was a pleasure meeting you today!    As discussed, I have sent your medications to your pharmacy.  Please schedule a 1 month follow up visit today, ok to do with a physical & fasting labs.    PLEASE NOTE: If you had any LAB tests please let us know if you have not heard back within a few days. You may see your results on MyChart before we have a chance to review them but we will give you a call once they are reviewed by [place]. If we ordered any REFERRALS today, please let us know if you have not heard from their office within the next week.  Let us know through MyChart if you are needing REFILLS, or have your pharmacy send [place] the request. You can also use MyChart to communicate with me or any office staff.  Please try these tips to maintain a healthy lifestyle: It is important that you exercise regularly at least 30 minutes 5 times a week. Think about what you will eat, plan ahead. Choose whole foods, & think  "clean, green, fresh or frozen" over canned, processed or packaged foods which are more sugary, salty, and fatty. 70 to 75% of food eaten should be fresh vegetables and protein. 2-3  meals daily with healthy snacks between meals, but must be whole fruit, protein or vegetables. Aim to eat over a 10 hour period when you are active, for example, 7am to 5pm, and then STOP after your last meal of the day, drinking only water.  Shorter eating windows, 6-8 hours, are showing benefits in heart disease and blood sugar regulation. Drink water every day! Shoot for 64 ounces daily = 8 cups, no other drink is as healthy! Fruit juice is best enjoyed in a healthy way, by EATING the fruit.

## 2023-12-05 NOTE — Progress Notes (Signed)
 New Patient Office Visit  Subjective:  Patient ID: Peter Kirby, male    DOB: 04-Sep-1991  Age: 33 y.o. MRN: [id number]  CC:  Chief Complaint  Patient presents with   New Patient (Initial Visit)   Anxiety    Pt would like to discuss Anxiety.     HPI Peter Kirby presents for establishing care today.  Discussed the use of AI scribe software for clinical note transcription with the patient, who gave verbal consent to proceed.  History of Present Illness   The patient, with a history of IBS managed with Viberzi, presents to establish care.  He reports having increasing anxiety over the past four months. The patient describes his anxiety as feeling like his "heart is going to beat out of my chest" and it occurs frequently, impacting his daily life. 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 The patient has sought help from a therapist, which has been beneficial, but the anxiety continues to be a significant issue. The patient is hesitant about taking medications due to having history of taking Focalin during college and discontinued due to mood changes and negative impacts on relationships. The patient does not drink alcohol frequently due to its association with increased anxiety the following day and interaction with his Viberzi.     Assessment & Plan:     Generalized Anxiety Disorder - Daily anxiety with palpitations exacerbated by stress. Discussed propranolol for situational anxiety and Lexapro for daily management. Lexapro recommended for lower side effects and quicker onset. Propranolol suggested for acute episodes. - Prescribe Lexapro at the lowest dose for daily use. - Prescribe propranolol for situational use, advising him to try it on a weekend first to assess tolerance. - Encourage lifestyle modifications: regular exercise, yoga, meditation, reduce caffeine and sugar. - Schedule  follow-up in one month to assess effectiveness and side effects.      Subjective:    Outpatient Medications Prior to Visit  Medication Sig Dispense Refill   Eluxadoline (VIBERZI) 75 MG TABS TAKE 1 TABLET (75 MG) BY MOUTH TWO TIMESDAILY.LIMIT ALCOHOLIC DRINKS TO NO MORE THAN 2 DRINKS WITHIN 24 HRS WHILE TAKINGDUE TO RISK OF PANCREATITIS. 180 tablet 1   No facility-administered medications prior to visit.   Past Medical History:  Diagnosis Date   Abdominal pain 10/21/2020   Allergy    Anxiety    Asthma    resolved; childhood exercise induced   Diarrhea 10/21/2020   IBS (irritable bowel syndrome)    Loss of weight 10/21/2020   Past Surgical History:  Procedure Laterality Date   BIOPSY  11/12/2020   Procedure: BIOPSY;  Surgeon: Corbin Ade, MD;  Location: [facility];  Service: Endoscopy;;   COLONOSCOPY WITH PROPOFOL N/A 11/12/2020   Surgeon: Corbin Ade, MD; normal examined colon, normal-appearing TI, s/p sigmoid biopsy (benign)    Objective:   Today's Vitals: BP 117/76 (BP Location: Left Arm, Patient Position: Sitting, Cuff Size: Normal)   Pulse 81   Temp 97.7 F (36.5 C) (Temporal)   Ht 5\' 9"  (1.753 m)   Wt 160 lb 3.2 oz (72.7 kg)   SpO2 98%   BMI 23.66 kg/m   Physical Exam Vitals and nursing note reviewed.  Constitutional:      General: He is not in acute distress.    Appearance: Normal appearance.  HENT:     Head: Normocephalic.  Cardiovascular:  Rate and Rhythm: Normal rate and regular rhythm.  Pulmonary:     Effort: Pulmonary effort is normal.     Breath sounds: Normal breath sounds.  Musculoskeletal:        General: Normal range of motion.     Cervical back: Normal range of motion.  Skin:    General: Skin is warm and dry.  Neurological:     Mental Status: He is alert and oriented to person, place, and time.  Psychiatric:        Mood and Affect: Mood normal.     Meds ordered this encounter  Medications   escitalopram (LEXAPRO) 5 MG  tablet    Sig: Take 1 tablet (5 mg total) by mouth daily.    Dispense:  30 tablet    Refill:  2    Supervising Provider:   ANDY, CAMILLE L [[id number]]   propranolol (INDERAL) 10 MG tablet    Sig: Take 1 tablet (10 mg total) by mouth 3 (three) times daily as needed. For increased anxiety.    Dispense:  30 tablet    Refill:  2    Supervising Provider:   ANDY, CAMILLE L [[id number]]    Dulce Sellar, NP

## 2023-12-28 ENCOUNTER — Encounter: Payer: Self-pay | Admitting: Family

## 2023-12-28 ENCOUNTER — Ambulatory Visit: Admitting: Family

## 2023-12-28 VITALS — BP 107/72 | HR 74 | Temp 98.2°F | Ht 69.0 in | Wt 159.4 lb

## 2023-12-28 DIAGNOSIS — Z0000 Encounter for general adult medical examination without abnormal findings: Secondary | ICD-10-CM | POA: Diagnosis not present

## 2023-12-28 DIAGNOSIS — F411 Generalized anxiety disorder: Secondary | ICD-10-CM

## 2023-12-28 DIAGNOSIS — Z114 Encounter for screening for human immunodeficiency virus [HIV]: Secondary | ICD-10-CM | POA: Diagnosis not present

## 2023-12-28 DIAGNOSIS — F419 Anxiety disorder, unspecified: Secondary | ICD-10-CM

## 2023-12-28 DIAGNOSIS — Z1159 Encounter for screening for other viral diseases: Secondary | ICD-10-CM | POA: Diagnosis not present

## 2023-12-28 MED ORDER — ESCITALOPRAM OXALATE 10 MG PO TABS: 10.0000 mg | ORAL_TABLET | Freq: Every day | ORAL | 1 refills | Status: DC

## 2023-12-28 NOTE — Assessment & Plan Note (Signed)
 On Lexapro for 3.5 weeks with no significant symptom improvement. Current dose may be insufficient. Willing to try increased dose despite concerns about side effects. Diarrhea likely a side effect of propranolol, especially given IBS and anxiety about gastrointestinal issues. - Increase Lexapro to 10 mg daily. - Discontinue propranolol if diarrhea persists. - Reassess symptoms in 2-3 weeks for efficacy and tolerability. - Consider medication switch if no improvement with increased dose. - Schedule 1 month f/u virtual visit to discuss

## 2023-12-28 NOTE — Patient Instructions (Addendum)
 It was very nice to see you today!   I will review your lab results via MyChart in a few days.  I have sent the higher dose of Lexapro to your pharmacy. Schedule a one month follow up visit - can be virtual.  Have a great weekend!      PLEASE NOTE:  If you had any lab tests please let us know if you have not heard back within a few days. You may see your results on MyChart before we have a chance to review them but we will give you a call once they are reviewed by [place]. If we ordered any referrals today, please let us know if you have not heard from their office within the next week.

## 2023-12-28 NOTE — Progress Notes (Signed)
 Phone: [phone]  Subjective:  Patient 33 y.o. male presenting for annual physical.  Chief Complaint  Patient presents with   Annual Exam    Non Fasting w/ labs   Anxiety  Discussed the use of AI scribe software for clinical note transcription with the patient, who gave verbal consent to proceed.  History of Present Illness The patient, with a history of IBS managed with daily Viberzi and anxiety, presents for a physical examination. He reports recent concerns about the side effects of propranolol, a medication he has recently started for his anxiety. Specifically, he experienced diarrhea shortly after taking the medication on two out of three separate occasions. He is unsure if this is a side effect of the medication or a symptom of his IBS. In addition to propranolol, the patient has been taking Lexapro for about three and a half weeks. He reports no noticeable difference since starting the medication, but also no adverse effects. He has been experiencing headaches, jaw clenching, and feeling hot, which he attributes to stress. He expresses interest in increasing the dosage of Lexapro to better manage these symptoms.  See problem oriented charting- ROS- full  review of systems was completed and negative except for: what is noted in HPI above.  The following were reviewed and entered/updated in epic: Past Medical History:  Diagnosis Date   Abdominal pain 10/21/2020   Allergy    Anxiety    Anxiety 08/29/2021   Asthma    resolved; childhood exercise induced   Diarrhea 10/21/2020   IBS (irritable bowel syndrome)    Loss of weight 10/21/2020   Patient Active Problem List   Diagnosis Date Noted   Generalized anxiety disorder 12/05/2023   Irritable bowel syndrome with diarrhea 08/29/2021   Past Surgical History:  Procedure Laterality Date   BIOPSY  11/12/2020   Procedure: BIOPSY;  Surgeon: Corbin Ade, MD;  [facility];  Service: Endoscopy;;   COLONOSCOPY WITH  PROPOFOL N/A 11/12/2020   Surgeon: Corbin Ade, MD; normal examined colon, normal-appearing TI, s/p sigmoid biopsy (benign)    Family History  Problem Relation Age of Onset   Diabetes Father    Diabetes Maternal Grandfather    COPD Paternal Grandmother    Diabetes Paternal Grandfather    Colon cancer Neg Hx    Gastric cancer Neg Hx    Esophageal cancer Neg Hx    Inflammatory bowel disease Neg Hx     Medications- reviewed and updated Current Outpatient Medications  Medication Sig Dispense Refill   Eluxadoline (VIBERZI) 75 MG TABS TAKE ONE TABLET (75 MG) BY MOUTH TWO TIMES DAILY. LIMIT ALCOHOLIC DRINKS TO NO MORE THAN 2 DRINKS WITHIN 24 HRS WHILE TAKING DUE TO RISK OF PANCREATITIS 180 tablet 0   propranolol (INDERAL) 10 MG tablet Take 1 tablet (10 mg total) by mouth 3 (three) times daily as needed. For increased anxiety. 30 tablet 2   escitalopram (LEXAPRO) 10 MG tablet Take 1 tablet (10 mg total) by mouth daily. 30 tablet 1   No current facility-administered medications for this visit.    Allergies-reviewed and updated Allergies  Allergen Reactions   Bee Venom Hives    Social History   Social History Narrative   Not on file    Objective:  BP 107/72 (BP Location: Left Arm, Patient Position: Sitting, Cuff Size: Large)   Pulse 74   Temp 98.2 F (36.8 C) (Temporal)   Ht 5\' 9"  (1.753 m)   Wt 159 lb 6 oz (72.3  kg)   SpO2 98%   BMI 23.54 kg/m  Physical Exam Vitals and nursing note reviewed.  Constitutional:      General: He is not in acute distress.    Appearance: Normal appearance.  HENT:     Head: Normocephalic.  Cardiovascular:     Rate and Rhythm: Normal rate and regular rhythm.  Pulmonary:     Effort: Pulmonary effort is normal.     Breath sounds: Normal breath sounds.  Musculoskeletal:        General: Normal range of motion.     Cervical back: Normal range of motion.  Skin:    General: Skin is warm and dry.  Neurological:     Mental Status: He is  alert and oriented to person, place, and time.  Psychiatric:        Mood and Affect: Mood normal.      Assessment and Plan   Health Maintenance counseling: 1. Anticipatory guidance: Patient counseled regarding regular dental exams q6 months, eye exams yearly, avoiding smoking and second hand smoke, limiting alcohol to 2 beverages per day.   2. Risk factor reduction:  Advised patient of need for regular exercise and diet rich in fruits and vegetables to reduce risk of heart attack and stroke.    Wt Readings from Last 3 Encounters:  12/28/23 159 lb 6 oz (72.3 kg)  12/05/23 160 lb 3.2 oz (72.7 kg)  05/11/23 164 lb 3.2 oz (74.5 kg)   3. Immunizations/screenings/ancillary studies  There is no immunization history on file for this patient. Health Maintenance Due  Topic Date Due   HIV Screening  Never done    4. Skin cancer screening-  advised regular sunscreen use. Denies worrisome, changing, or new skin lesions.  5. Smoking associated screening:  never smoker 6. STD screening - N/A 7. Alcohol screening: socially  Assessment & Plan Generalized Anxiety Disorder - On Lexapro for 3.5 weeks with no significant symptom improvement. Current dose may be insufficient. Willing to try increased dose despite concerns about side effects. Diarrhea likely a side effect of propranolol, especially given IBS and anxiety about gastrointestinal issues. - Increase Lexapro to 10 mg daily. - Discontinue propranolol if diarrhea persists. - Reassess symptoms in 2-3 weeks for efficacy and tolerability. - Consider medication switch if no improvement with increased dose. - Schedule 1 month f/u virtual visit to discuss  General Health Maintenance - Pt has form for work to sign stating physical completed. Non-fasting may affect glucose and triglyceride levels. - Order full panel labs: thyroid, cholesterol, kidney, liver function tests, hepatitis C and HIV screenings, blood count for anemia. - Ensure labs sent  to appropriate lab per insurance. - Encourage healthy diet, continued exercise as many days as possible, drinking at least 2L water qd, wearing sunscreen daily.  Recommended follow up: Return in about 4 weeks (around 01/25/2024) for anxiety, med refills, virtual ok. Future Appointments  Date Time Provider Department Center  01/25/2024  8:30 AM Dulce Sellar, NP [facility]    Lab/Order associations: not- fasting   Dulce Sellar, NP

## 2023-12-29 LAB — LIPID PANEL
Cholesterol: 204 mg/dL — ABNORMAL HIGH (ref <200)
HDL: 43 mg/dL (ref >=40)
LDL Cholesterol (Calc): 105 mg/dL — ABNORMAL HIGH
Non-HDL Cholesterol (Calc): 161 mg/dL — ABNORMAL HIGH (ref <130)
Total CHOL/HDL Ratio: 4.7 (calc) (ref <5.0)
Triglycerides: 392 mg/dL — ABNORMAL HIGH (ref <150)

## 2023-12-29 LAB — COMPREHENSIVE METABOLIC PANEL WITH GFR
AG Ratio: 2 (calc) (ref 1.0–2.5)
ALT: 17 U/L (ref 9–46)
AST: 15 U/L (ref 10–40)
Albumin: 4.9 g/dL (ref 3.6–5.1)
Alkaline phosphatase (APISO): 65 U/L (ref 36–130)
BUN: 17 mg/dL (ref 7–25)
CO2: 24 mmol/L (ref 20–32)
Calcium: 10 mg/dL (ref 8.6–10.3)
Chloride: 104 mmol/L (ref 98–110)
Creat: 1.22 mg/dL (ref 0.60–1.26)
Globulin: 2.4 g/dL (ref 1.9–3.7)
Glucose, Bld: 82 mg/dL (ref 65–99)
Potassium: 4 mmol/L (ref 3.5–5.3)
Sodium: 141 mmol/L (ref 135–146)
Total Bilirubin: 0.9 mg/dL (ref 0.2–1.2)
Total Protein: 7.3 g/dL (ref 6.1–8.1)
eGFR: 81 mL/min/{1.73_m2} (ref >=60)

## 2023-12-29 LAB — CBC WITH DIFFERENTIAL/PLATELET
Absolute Lymphocytes: 2105 {cells}/uL (ref 850–3900)
Absolute Monocytes: 476 {cells}/uL (ref 200–950)
Basophils Absolute: 17 {cells}/uL (ref 0–200)
Basophils Relative: 0.3 %
Eosinophils Absolute: 81 {cells}/uL (ref 15–500)
Eosinophils Relative: 1.4 %
HCT: 48.3 % (ref 38.5–50.0)
Hemoglobin: 16.8 g/dL (ref 13.2–17.1)
MCH: 30.1 pg (ref 27.0–33.0)
MCHC: 34.8 g/dL (ref 32.0–36.0)
MCV: 86.4 fL (ref 80.0–100.0)
MPV: 12.1 fL (ref 7.5–12.5)
Monocytes Relative: 8.2 %
Neutro Abs: 3120 {cells}/uL (ref 1500–7800)
Neutrophils Relative %: 53.8 %
Platelets: 210 10*3/uL (ref 140–400)
RBC: 5.59 10*6/uL (ref 4.20–5.80)
RDW: 12.9 % (ref 11.0–15.0)
Total Lymphocyte: 36.3 %
WBC: 5.8 10*3/uL (ref 3.8–10.8)

## 2023-12-29 LAB — HIV ANTIBODY (ROUTINE TESTING W REFLEX): HIV 1&2 Ab, 4th Generation: NONREACTIVE

## 2023-12-29 LAB — TSH: TSH: 1.4 m[IU]/L (ref 0.40–4.50)

## 2023-12-29 LAB — HEPATITIS C ANTIBODY: Hepatitis C Ab: NONREACTIVE

## 2023-12-31 ENCOUNTER — Encounter: Payer: Self-pay | Admitting: Family

## 2024-01-01 DIAGNOSIS — F411 Generalized anxiety disorder: Secondary | ICD-10-CM | POA: Diagnosis not present

## 2024-01-15 DIAGNOSIS — F411 Generalized anxiety disorder: Secondary | ICD-10-CM | POA: Diagnosis not present

## 2024-01-25 ENCOUNTER — Encounter: Payer: Self-pay | Admitting: Family

## 2024-01-25 ENCOUNTER — Telehealth (INDEPENDENT_AMBULATORY_CARE_PROVIDER_SITE_OTHER): Admitting: Family

## 2024-01-25 DIAGNOSIS — E782 Mixed hyperlipidemia: Secondary | ICD-10-CM | POA: Insufficient documentation

## 2024-01-25 DIAGNOSIS — F411 Generalized anxiety disorder: Secondary | ICD-10-CM | POA: Diagnosis not present

## 2024-01-25 NOTE — Assessment & Plan Note (Signed)
 On Lexapro  10mg  with symptom improvement. Not at goal completely. Did not feel need to use Propranolol . - Continue Lexapro  to 10 mg daily. - After 2 more weeks, and still not at goal with symptoms, ok to increase to 15mg  qd = 1.5 pills - Take propranolol  10mg  prn, if diarrhea returns, discontinue. - Schedule 4 month f/u visit in office.

## 2024-01-25 NOTE — Progress Notes (Signed)
 MyChart Video Visit    Virtual Visit via Video Note   This format is felt to be most appropriate for this patient at this time. Physical exam was limited by quality of the video and audio technology used for the visit. CMA was able to get the patient set up on a video visit.  Patient location: Home. Patient and provider in visit Provider location: Office  I discussed the limitations of evaluation and management by telemedicine and the availability of in person appointments. The patient expressed understanding and agreed to proceed.  Visit Date: 01/25/2024  Today's healthcare provider: Versa Gore, NP     Subjective:   Patient ID: Peter Kirby, male    DOB: 1990-11-03, 33 y.o.   MRN: [id number]  Chief Complaint  Patient presents with   Anxiety   HPI: Peter Kirby was started on Lexapro  5mg  approximately 2 months ago. He noticed no improvement in symptoms one month ago and the dose was increased to 10mg . Today he is presenting for follow up and reports that he can feel this dose is working, though not completely at goal. He denies any SE from the dose increase. He also is concerned about his high cholesterol numbers and wants to discuss.  Assessment & Plan:   Problem List Items Addressed This Visit     Generalized anxiety disorder - Primary   On Lexapro  10mg  with symptom improvement. Not at goal completely. Did not feel need to use Propranolol . - Continue Lexapro  to 10 mg daily. - After 2 more weeks, and still not at goal with symptoms, ok to increase to 15mg  qd = 1.5 pills - Take propranolol  10mg  prn, if diarrhea returns, discontinue. - Schedule 4 month f/u visit in office.      Mixed hyperlipidemia   TC 204, LDL 105, & Triglycerides 392 (non-fasting). Admits to diet with high fat. - Discussed lifestyle changes necessary, low saturated fat diet, reduce carbohydrates/sweets, avoid processed foods, eat more whole foods daily, and increase cardio exercise where  able. - F/U in 6 months & recheck labs fasting      Past Medical History:  Diagnosis Date   Abdominal pain 10/21/2020   Allergy    Anxiety    Anxiety 08/29/2021   Asthma    resolved; childhood exercise induced   Diarrhea 10/21/2020   IBS (irritable bowel syndrome)    Loss of weight 10/21/2020    Past Surgical History:  Procedure Laterality Date   BIOPSY  11/12/2020   Procedure: BIOPSY;  Surgeon: Suzette Espy, MD;  [facility];  Service: Endoscopy;;   COLONOSCOPY WITH PROPOFOL  N/A 11/12/2020   Surgeon: Suzette Espy, MD; normal examined colon, normal-appearing TI, s/p sigmoid biopsy (benign)    Outpatient Medications Prior to Visit  Medication Sig Dispense Refill   Eluxadoline  (VIBERZI ) 75 MG TABS TAKE ONE TABLET (75 MG) BY MOUTH TWO TIMES DAILY. LIMIT ALCOHOLIC DRINKS TO NO MORE THAN 2 DRINKS WITHIN 24 HRS WHILE TAKING DUE TO RISK OF PANCREATITIS 180 tablet 0   escitalopram  (LEXAPRO ) 10 MG tablet Take 1 tablet (10 mg total) by mouth daily. 30 tablet 1   propranolol  (INDERAL ) 10 MG tablet Take 1 tablet (10 mg total) by mouth 3 (three) times daily as needed. For increased anxiety. 30 tablet 2   No facility-administered medications prior to visit.    Allergies  Allergen Reactions   Bee Venom Hives       Objective:   Physical Exam Vitals and nursing note  reviewed.  Constitutional:      General: Pt is not in acute distress.    Appearance: Normal appearance.  HENT:     Head: Normocephalic.  Pulmonary:     Effort: No respiratory distress.  Musculoskeletal:     Cervical back: Normal range of motion.  Skin:    General: Skin is dry.     Coloration: Skin is not pale.  Neurological:     Mental Status: Pt is alert and oriented to person, place, and time.  Psychiatric:        Mood and Affect: Mood normal.   There were no vitals taken for this visit.  Wt Readings from Last 3 Encounters:  12/28/23 159 lb 6 oz (72.3 kg)  12/05/23 160 lb 3.2 oz (72.7 kg)   05/11/23 164 lb 3.2 oz (74.5 kg)      I discussed the assessment and treatment plan with the patient. The patient was provided an opportunity to ask questions and all were answered. The patient agreed with the plan and demonstrated an understanding of the instructions.   The patient was advised to call back or seek an in-person evaluation if the symptoms worsen or if the condition fails to improve as anticipated.  Versa Gore, NP [facility] [phone] (fax)  [facility]

## 2024-01-25 NOTE — Assessment & Plan Note (Signed)
 TC 204, LDL 105, & Triglycerides 392 (non-fasting). Admits to diet with high fat. - Discussed lifestyle changes necessary, low saturated fat diet, reduce carbohydrates/sweets, avoid processed foods, eat more whole foods daily, and increase cardio exercise where able. - F/U in 6 months & recheck labs fasting

## 2024-01-29 DIAGNOSIS — F411 Generalized anxiety disorder: Secondary | ICD-10-CM | POA: Diagnosis not present

## 2024-02-26 DIAGNOSIS — F411 Generalized anxiety disorder: Secondary | ICD-10-CM | POA: Diagnosis not present

## 2024-03-14 ENCOUNTER — Other Ambulatory Visit: Payer: Self-pay | Admitting: Family

## 2024-03-14 DIAGNOSIS — F419 Anxiety disorder, unspecified: Secondary | ICD-10-CM

## 2024-03-25 DIAGNOSIS — F411 Generalized anxiety disorder: Secondary | ICD-10-CM | POA: Diagnosis not present

## 2024-04-15 ENCOUNTER — Encounter: Payer: Self-pay | Admitting: Family

## 2024-04-15 ENCOUNTER — Ambulatory Visit (INDEPENDENT_AMBULATORY_CARE_PROVIDER_SITE_OTHER): Admitting: Family

## 2024-04-15 VITALS — BP 110/80 | HR 67 | Temp 97.9°F | Ht 69.0 in | Wt 171.0 lb

## 2024-04-15 DIAGNOSIS — F411 Generalized anxiety disorder: Secondary | ICD-10-CM | POA: Diagnosis not present

## 2024-04-15 NOTE — Progress Notes (Signed)
 Patient ID: Peter Kirby, male    DOB: 1990/10/20, 33 y.o.   MRN: [id number]  Chief Complaint  Patient presents with   Fatigue    Pt c/o fatigue for the past 1.5 months.  Discussed the use of AI scribe software for clinical note transcription with the patient, who gave verbal consent to proceed.  History of Present Illness Peter Kirby is a 33 year old male who presents with excessive fatigue and somnolence.  Fatigue and somnolence - Significant fatigue and somnolence for the past month and a half - Frequently falls asleep when sitting, especially on weekends - Feels exhausted despite sleeping over nine hours nightly - Fatigue persists after work and often dozes off while driving home - Previously managed on six hours of sleep but now requires more rest - No changes in activity level, maintaining five to seven thousand steps daily - No changes in diet or caffeine intake - Hydrates well but unsure if consuming two to two and a half liters of water  daily  Sleep quality and patterns - Good sleep quality, sleeps well without waking during the night - Requires more sleep than previously, now sleeping over nine hours nightly  Medication use and effects - Takes Lexapro  in the morning for anxiety - Does not take propranolol  - No increase in energy after taking Lexapro  - Experiences fatigue in the late afternoon  Assessment & Plan Fatigue Fatigue possibly linked to morning Lexapro  dosing. No change in daily habits. Consider alternative medications if switching timing fails. - Switch Lexapro  to nighttime dosing, one hour before bedtime. - Monitor symptoms for 1-2 weeks. - Continue exercise routine. - Ensure hydration of 2-2.5 liters daily and no skipping meals.  Anxiety Lexapro  effectively manages anxiety. Cautious about changing due to effectiveness. - Continue Lexapro  for anxiety, switch to bedtime dosing.  - Consider Prozac, Effexor, or Trintellix if fatigue persists  despite nighttime dosing change. - Notify via MyChart if sx are not still not improved.    Subjective:    Outpatient Medications Prior to Visit  Medication Sig Dispense Refill   Eluxadoline  (VIBERZI ) 75 MG TABS TAKE ONE TABLET (75 MG) BY MOUTH TWO TIMES DAILY. LIMIT ALCOHOLIC DRINKS TO NO MORE THAN 2 DRINKS WITHIN 24 HRS WHILE TAKING DUE TO RISK OF PANCREATITIS 180 tablet 0   escitalopram  (LEXAPRO ) 10 MG tablet TAKE ONE TABLET (10MG  TOTAL) BY MOUTH DAILY 30 tablet 1   propranolol  (INDERAL ) 10 MG tablet Take 1 tablet (10 mg total) by mouth 3 (three) times daily as needed. For increased anxiety. 30 tablet 2   No facility-administered medications prior to visit.   Past Medical History:  Diagnosis Date   Abdominal pain 10/21/2020   Allergy    Anxiety    Anxiety 08/29/2021   Asthma    resolved; childhood exercise induced   Diarrhea 10/21/2020   IBS (irritable bowel syndrome)    Loss of weight 10/21/2020   Past Surgical History:  Procedure Laterality Date   BIOPSY  11/12/2020   Procedure: BIOPSY;  Surgeon: Shaaron Lamar HERO, MD;  [facility];  Service: Endoscopy;;   COLONOSCOPY WITH PROPOFOL  N/A 11/12/2020   Surgeon: Shaaron Lamar HERO, MD; normal examined colon, normal-appearing TI, s/p sigmoid biopsy (benign)   Allergies  Allergen Reactions   Bee Venom Hives      Objective:    Physical Exam Vitals and nursing note reviewed.  Constitutional:      General: He is not in acute distress.  Appearance: Normal appearance.  HENT:     Head: Normocephalic.  Cardiovascular:     Rate and Rhythm: Normal rate and regular rhythm.  Pulmonary:     Effort: Pulmonary effort is normal.     Breath sounds: Normal breath sounds.  Musculoskeletal:        General: Normal range of motion.     Cervical back: Normal range of motion.  Skin:    General: Skin is warm and dry.  Neurological:     Mental Status: He is alert and oriented to person, place, and time.  Psychiatric:         Mood and Affect: Mood normal.    BP 110/80 (BP Location: Left Arm, Patient Position: Sitting, Cuff Size: Large)   Pulse 67   Temp 97.9 F (36.6 C) (Temporal)   Ht 5' 9 (1.753 m)   Wt 171 lb (77.6 kg)   SpO2 95%   BMI 25.25 kg/m  Wt Readings from Last 3 Encounters:  04/15/24 171 lb (77.6 kg)  12/28/23 159 lb 6 oz (72.3 kg)  12/05/23 160 lb 3.2 oz (72.7 kg)       Lucius Krabbe, NP

## 2024-04-15 NOTE — Patient Instructions (Signed)
 It was very nice to see you today!   If switching Lexapro  to night time dosing still does not help your fatigue after 1-2 weeks, then we can consider switching to generic Prozac or Effexor, or, Trintellix (not generic) which are all good for anxiety and do not cause fatigue.       PLEASE NOTE:  If you had any lab tests please let us  know if you have not heard back within a few days. You may see your results on MyChart before we have a chance to review them but we will give you a call once they are reviewed by us . If we ordered any referrals today, please let us  know if you have not heard from their office within the next week.

## 2024-04-15 NOTE — Assessment & Plan Note (Signed)
 Lexapro  effectively manages anxiety. Cautious about changing due to effectiveness. - Continue Lexapro  for anxiety, switch to bedtime dosing.  - Consider Prozac, Effexor, or Trintellix if fatigue persists despite nighttime dosing change. - Notify via MyChart if sx are not still not improved.

## 2024-04-18 ENCOUNTER — Encounter: Payer: Self-pay | Admitting: Internal Medicine

## 2024-04-29 DIAGNOSIS — F411 Generalized anxiety disorder: Secondary | ICD-10-CM | POA: Diagnosis not present

## 2024-05-27 ENCOUNTER — Encounter: Payer: Self-pay | Admitting: Family

## 2024-05-27 ENCOUNTER — Telehealth (INDEPENDENT_AMBULATORY_CARE_PROVIDER_SITE_OTHER): Admitting: Family

## 2024-05-27 ENCOUNTER — Ambulatory Visit: Admitting: Family

## 2024-05-27 DIAGNOSIS — F411 Generalized anxiety disorder: Secondary | ICD-10-CM | POA: Diagnosis not present

## 2024-05-27 MED ORDER — VENLAFAXINE HCL ER 37.5 MG PO CP24
37.5000 mg | ORAL_CAPSULE | Freq: Every day | ORAL | 1 refills | Status: DC
Start: 2024-05-27 — End: 2024-07-14

## 2024-05-27 NOTE — Assessment & Plan Note (Signed)
 Lexapro  effective but causes drowsiness. Considered Effexor  for its stimulant properties and potential to improve focus. Discussed headache risk. - Discontinue Lexapro . - Initiate Effexor  37.5 mg daily in the morning. - Monitor for headaches, manage with Tylenol if necessary, other possible SE. - Follow up in 3 weeks, ok if virtual appointment.

## 2024-05-27 NOTE — Progress Notes (Signed)
 MyChart Video Visit    Virtual Visit via Video Note   This format is felt to be most appropriate for this patient at this time. Physical exam was limited by quality of the video and audio technology used for the visit. CMA was able to get the patient set up on a video visit.  Patient location: Home. Patient and provider in visit Provider location: Office  I discussed the limitations of evaluation and management by telemedicine and the availability of in person appointments. The patient expressed understanding and agreed to proceed.  Visit Date: 05/27/2024  Today's healthcare provider: Lucius Krabbe, NP     Subjective:   Patient ID: Peter Kirby, male    DOB: 1991-02-16, 33 y.o.   MRN: [id number]  Chief Complaint  Patient presents with   Generalized Anxiety Disorder  Discussed the use of AI scribe software for clinical note transcription with the patient, who gave verbal consent to proceed.  History of Present Illness   Peter Kirby is a 33 year old male who presents for follow-up regarding Lexapro  use and its side effects.  Adverse effects of escitalopram  (lexapro ) - Significant tiredness persists despite changing administration from day to night. - Lexapro  effectively manages anxiety. - No changes in mood or anxiety levels since starting Lexapro .  Anxiety symptoms - Anxiety is well controlled with Lexapro , but does not like drowsy effect. - Rarely encounters anxiety-triggering situations. - Does not take propranolol  regularly.  Irritable bowel syndrome - Continues daily Viberzi  for irritable bowel syndrome.     Assessment & Plan:     Generalized anxiety disorder Lexapro  effective but causes drowsiness. Considered Effexor  for its stimulant properties and potential to improve focus. Discussed headache risk. - Discontinue Lexapro . - Initiate Effexor  37.5 mg daily in the morning. - Monitor for headaches, manage with Tylenol if necessary, other  possible SE. - Follow up in 3 weeks, ok if virtual appointment.     Past Medical History:  Diagnosis Date   Abdominal pain 10/21/2020   Allergy    Anxiety    Anxiety 08/29/2021   Asthma    resolved; childhood exercise induced   Diarrhea 10/21/2020   IBS (irritable bowel syndrome)    Loss of weight 10/21/2020    Past Surgical History:  Procedure Laterality Date   BIOPSY  11/12/2020   Procedure: BIOPSY;  Surgeon: Shaaron Lamar HERO, MD;  [facility];  Service: Endoscopy;;   COLONOSCOPY WITH PROPOFOL  N/A 11/12/2020   Surgeon: Shaaron Lamar HERO, MD; normal examined colon, normal-appearing TI, s/p sigmoid biopsy (benign)    Outpatient Medications Prior to Visit  Medication Sig Dispense Refill   Eluxadoline  (VIBERZI ) 75 MG TABS TAKE ONE TABLET (75 MG) BY MOUTH TWO TIMES DAILY. LIMIT ALCOHOLIC DRINKS TO NO MORE THAN 2 DRINKS WITHIN 24 HRS WHILE TAKING DUE TO RISK OF PANCREATITIS 180 tablet 0   escitalopram  (LEXAPRO ) 10 MG tablet TAKE ONE TABLET (10MG  TOTAL) BY MOUTH DAILY 30 tablet 1   propranolol  (INDERAL ) 10 MG tablet Take 1 tablet (10 mg total) by mouth 3 (three) times daily as needed. For increased anxiety. 30 tablet 2   No facility-administered medications prior to visit.    Allergies  Allergen Reactions   Bee Venom Hives       Objective:   Physical Exam Vitals and nursing note reviewed.  Constitutional:      General: Pt is not in acute distress.    Appearance: Normal appearance.  HENT:  Head: Normocephalic.  Pulmonary:     Effort: No respiratory distress.  Musculoskeletal:     Cervical back: Normal range of motion.  Skin:    General: Skin is dry.     Coloration: Skin is not pale.  Neurological:     Mental Status: Pt is alert and oriented to person, place, and time.  Psychiatric:        Mood and Affect: Mood normal.   There were no vitals taken for this visit.  Wt Readings from Last 3 Encounters:  04/15/24 171 lb (77.6 kg)  12/28/23 159 lb 6 oz  (72.3 kg)  12/05/23 160 lb 3.2 oz (72.7 kg)      I discussed the assessment and treatment plan with the patient. The patient was provided an opportunity to ask questions and all were answered. The patient agreed with the plan and demonstrated an understanding of the instructions.   The patient was advised to call back or seek an in-person evaluation if the symptoms worsen or if the condition fails to improve as anticipated.  Lucius Krabbe, NP [facility] at [facility] [phone] (phone) [phone] (fax)  [facility]

## 2024-06-16 NOTE — Progress Notes (Unsigned)
 GI Office Note    Referring Provider: Lucius Krabbe, NP Primary Care Physician:  Lucius Krabbe, NP Primary Gastroenterologist: Lamar HERO.Rourk, MD  Date:  06/17/2024  ID:  Peter Kirby, DOB [birth year], MRN [id number]   Chief Complaint   Chief Complaint  Patient presents with   Follow-up    Patient here today for a follow up on IBS Diarrhea. Patient is taking Viberzi  75 mg once per day, which does control his symptoms. He will need refills on this today.   History of Present Illness  Peter Kirby is a 33 y.o. male with a history of anxiety and IBS-diarrhea presenting today  for follow-up on IBS-C in need of refill on Viberzi .  Colonoscopy Feb 2022: - The entire examined colon is normal.  - Normal appearing terminal ileum; status post sigmoid biopsy.  - No evidence of inflammatory bowel disease seen on today' s examination.  - Lifelong intermittent abdominal cramps and diarrhea. He developed significant worsening of symptoms last fall. Gastrointestinal pathology panel was positive for Sapovirus. I suspect we are dealing with postinfectious irritable bowel syndrome. However, he needs a celiac screen prior to making a firm diagnosis of IBS.  Celiac panel negative Feb 2022.   Last office visit August 2024 with Dr. Shaaron.  Sopped probiotic as he didn't feel like it was making a difference. Having great response from Viberzi  for his IBS-D. Having only once monthly episode of abdominal cramping and diarrhea. No bleeding episodes. Reports conservative alcohol consumption. Advised to continue Viberzi , conservative alcohol use considered. Avoid triggers. Follow up in 1 year.   Labs April 2025: Normal CBC, CMP, TSH.  Negative hep C antibody and HIV.  Lipid panel with elevated cholesterol, elevated triglycerides at 392, and elevated LDL at 105.  Dietary recommendations were given to him by PCP.  Today:  Discussed the use of AI scribe software for clinical note  transcription with the patient, who gave verbal consent to proceed.  He has been taking Viberzi  for approximately two years, which generally allows for daily bowel movements. Occasionally, he experiences constipation, described as skipping a day without a bowel movement rather than having hard stools. During a recent trip to [place], he forgot his medication for ten days, resulting in irregular bowel movements ranging from none to three or four times a day. Upon resuming Viberzi , his symptoms returned to normal.  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 He has a history of stress-related gastrointestinal symptoms dating back to high school, which have persisted through various jobs. He is in a relationship, leading to more frequent dining out which may have contributed to weight gain, and has recently resumed exercising after a period of inactivity.  He takes Viberzi  once a day, although it is prescribed for twice daily use. He initially took it twice daily but reduced the frequency due to constipation. He manages the cost of the medication through coupons as it is expensive and has to meet his deductible yearly to make the medication more affordable.   He experiences occasional abdominal cramps, typically preceding diarrhea, but notes that these have decreased since his diarrhea has been controlled. No severe constipation or hard stools, and his constipation is more about irregularity rather than pain.  His alcohol intake is generally low, with occasional higher consumption during social events which he states is rare. He tries to stay  hydrated to mitigate these effects. He has tried various treatments before Viberzi , including hyoscyamine , dicyclomine , psyllium husk, and probiotics, with Viberzi  being the most effective for his symptoms.      Wt  Readings from Last 5 Encounters:  06/17/24 175 lb 11.2 oz (79.7 kg)  04/15/24 171 lb (77.6 kg)  12/28/23 159 lb 6 oz (72.3 kg)  12/05/23 160 lb 3.2 oz (72.7 kg)  05/11/23 164 lb 3.2 oz (74.5 kg)    Current Outpatient Medications  Medication Sig Dispense Refill   Eluxadoline  (VIBERZI ) 75 MG TABS TAKE ONE TABLET (75 MG) BY MOUTH TWO TIMES DAILY. LIMIT ALCOHOLIC DRINKS TO NO MORE THAN 2 DRINKS WITHIN 24 HRS WHILE TAKING DUE TO RISK OF PANCREATITIS 180 tablet 0   propranolol  (INDERAL ) 10 MG tablet Take 1 tablet (10 mg total) by mouth 3 (three) times daily as needed. For increased anxiety. 30 tablet 2   venlafaxine  XR (EFFEXOR  XR) 37.5 MG 24 hr capsule Take 1 capsule (37.5 mg total) by mouth daily with breakfast. 301 capsule 1   No current facility-administered medications for this visit.    Past Medical History:  Diagnosis Date   Abdominal pain 10/21/2020   Allergy    Anxiety    Anxiety 08/29/2021   Asthma    resolved; childhood exercise induced   Diarrhea 10/21/2020   IBS (irritable bowel syndrome)    Loss of weight 10/21/2020    Past Surgical History:  Procedure Laterality Date   BIOPSY  11/12/2020   Procedure: BIOPSY;  Surgeon: Shaaron Lamar HERO, MD;  [facility]: [facility];  Service: Endoscopy;;   COLONOSCOPY WITH PROPOFOL  N/A 11/12/2020   Surgeon: Shaaron Lamar HERO, MD; normal examined colon, normal-appearing TI, s/p sigmoid biopsy (benign)    Family History  Problem Relation Age of Onset   Diabetes Father    Diabetes Maternal Grandfather    COPD Paternal Grandmother    Diabetes Paternal Grandfather    Colon cancer Neg Hx    Gastric cancer Neg Hx    Esophageal cancer Neg Hx    Inflammatory bowel disease Neg Hx     Allergies as of 06/17/2024 - Review Complete 06/17/2024  Allergen Reaction Noted   Bee venom Hives 08/20/2013    Social History   Socioeconomic History   Marital status: Single    Spouse name: Not on file   Number of children: Not on file   Years of  education: Not on file   Highest education level: Master's degree (e.g., MA, MS, MEng, MEd, MSW, MBA)  Occupational History   Not on file  Tobacco Use   Smoking status: Never   Smokeless tobacco: Never  Vaping Use   Vaping status: Never Used  Substance and Sexual Activity   Alcohol use: Yes    Comment: social on weekend typically 2-4 drinks   Drug use: Never   Sexual activity: Yes  Other Topics Concern   Not on file  Social History Narrative   Not on file   Social Drivers of Health   Financial Resource Strain: Low Risk  (12/01/2023)   Overall Financial Resource Strain (CARDIA)    Difficulty of Paying Living Expenses: Not hard at all  Food Insecurity: No Food Insecurity (12/01/2023)   Hunger Vital Sign    Worried About Running Out of Food in the Last Year: Never true    Ran Out of Food in the Last Year: Never true  Transportation Needs: No Transportation Needs (12/01/2023)   PRAPARE - Transportation  Lack of Transportation (Medical): No    Lack of Transportation (Non-Medical): No  Physical Activity: Insufficiently Active (12/01/2023)   Exercise Vital Sign    Days of Exercise per Week: 4 days    Minutes of Exercise per Session: 20 min  Stress: Stress Concern Present (12/01/2023)   [employer] of Occupational Health - Occupational Stress Questionnaire    Feeling of Stress : Very much  Social Connections: Moderately Integrated (12/01/2023)   Social Connection and Isolation Panel    Frequency of Communication with Friends and Family: More than three times a week    Frequency of Social Gatherings with Friends and Family: More than three times a week    Attends Religious Services: More than 4 times per year    Active Member of [employer] or Organizations: Yes    Attends Engineer, structural: More than 4 times per year    Marital Status: Never married   Review of Systems   Gen: Denies fever, chills, anorexia. Denies fatigue, weakness, weight loss.  CV: Denies chest pain,  palpitations, syncope, peripheral edema, and claudication. Resp: Denies dyspnea at rest, cough, wheezing, coughing up blood, and pleurisy. GI: See HPI Derm: Denies rash, itching, dry skin Psych: Denies depression, anxiety, memory loss, confusion. No homicidal or suicidal ideation.  Heme: Denies bruising, bleeding, and enlarged lymph nodes.  Physical Exam   BP 118/79 (BP Location: Right Arm, Patient Position: Sitting, Cuff Size: Normal)   Pulse 89   Temp (!) 97.1 F (36.2 C) (Temporal)   Ht 5' 8 (1.727 m)   Wt 175 lb 11.2 oz (79.7 kg)   BMI 26.72 kg/m   General:   Alert and oriented. No distress noted. Pleasant and cooperative.  Head:  Normocephalic and atraumatic. Eyes:  Conjuctiva clear without scleral icterus. Mouth:  Oral mucosa pink and moist. Good dentition. No lesions. Abdomen:  +BS, soft, non-tender and non-distended. No rebound or guarding. No HSM or masses noted. Rectal: deferred Msk:  Symmetrical without gross deformities. Normal posture. Extremities:  Without edema. Neurologic:  Alert and  oriented x4 Psych:  Alert and cooperative. Normal mood and affect.  Assessment & Plan  Peter Kirby is a 33 y.o. male presenting today with for follow-up on IBS-D.     Irritable bowel syndrome with diarrhea and intermittent constipation Chronic irritable bowel syndrome with diarrhea and intermittent constipation, managed with Viberzi  for two years. Reports daily bowel movements with occasional constipation, especially when traveling or missing medication. Experiences abdominal cramps associated with diarrhea, which have decreased with Viberzi  use. Reports weight gain and lifestyle changes, including increased stress and anxiety, which may contribute to symptoms. Alcohol intake is minimal but can exacerbate symptoms. Current regimen of Viberzi  once daily is effective, but occasional constipation suggests possible need for dose adjustment. Discussed potential impact of anxiety  medication on bowel habits and the role of exercise in symptom management. Discussed risks of alcohol use, including potential for gallstones, gallbladder inflammation, and pancreatitis, and emphasized importance of hydration to mitigate these risks. - Continue Viberzi  as prescribed, with flexibility to adjust dosing frequency based on symptoms. - Encourage regular exercise to help manage constipation and improve bowel regularity. - Advise maintaining hydration, especially when consuming alcohol, to reduce risk of pancreatitis and other complications. Ongoing conservative use of alcohol is recommended. - Explore potential for updated Viberzi  coupons to manage medication costs. - Consider alternative treatments such as Xifaxin if Viberzi  becomes financially burdensome or if symptoms worsen.      Follow  up   Follow up 1 year.    Charmaine Melia, MSN, FNP-BC, AGACNP-BC [facility]

## 2024-06-17 ENCOUNTER — Ambulatory Visit (INDEPENDENT_AMBULATORY_CARE_PROVIDER_SITE_OTHER): Admitting: Gastroenterology

## 2024-06-17 ENCOUNTER — Encounter: Payer: Self-pay | Admitting: Gastroenterology

## 2024-06-17 VITALS — BP 118/79 | HR 89 | Temp 97.1°F | Ht 68.0 in | Wt 175.7 lb

## 2024-06-17 DIAGNOSIS — K582 Mixed irritable bowel syndrome: Secondary | ICD-10-CM

## 2024-06-17 DIAGNOSIS — K580 Irritable bowel syndrome with diarrhea: Secondary | ICD-10-CM

## 2024-06-17 MED ORDER — VIBERZI 75 MG PO TABS: ORAL_TABLET | ORAL | 3 refills | Status: AC

## 2024-06-17 MED ORDER — VIBERZI 75 MG PO TABS: ORAL_TABLET | ORAL | 3 refills | Status: DC

## 2024-06-17 NOTE — Patient Instructions (Addendum)
 You can go to [email or web address] and fill out the form or text as outlined below to get a savings card when it is time.     For now continue taking Viberzi  75 mg 1-2 times daily. If having constipation you may skip a dose if needed.   If stools are hard you could try a gentle stool softener.   Please continue to limit alcohol use as much as possible to reduce the risk of pancreatitis. Please let us  know if you are having any difficulties with your medication. If cost becomes an issue or constipation becomes too much please let me know and we may be able to consider a trial of a medication called Xifaxin to help with your IBS.   Follow up annually.   It was a pleasure to see you today. I want to create trusting relationships with patients. If you receive a survey regarding your visit,  I greatly appreciate you taking time to fill this out on paper or through your MyChart. I value your feedback.  Charmaine Melia, MSN, FNP-BC, AGACNP-BC [facility]

## 2024-07-14 ENCOUNTER — Encounter: Payer: Self-pay | Admitting: Family

## 2024-07-14 ENCOUNTER — Telehealth (INDEPENDENT_AMBULATORY_CARE_PROVIDER_SITE_OTHER): Admitting: Family

## 2024-07-14 DIAGNOSIS — F411 Generalized anxiety disorder: Secondary | ICD-10-CM | POA: Diagnosis not present

## 2024-07-14 MED ORDER — FLUOXETINE HCL 10 MG PO CAPS: 10.0000 mg | ORAL_CAPSULE | Freq: Every day | ORAL | 1 refills | Status: AC

## 2024-07-14 NOTE — Progress Notes (Signed)
 MyChart Video Visit    Virtual Visit via Video Note   This format is felt to be most appropriate for this patient at this time. Physical exam was limited by quality of the video and audio technology used for the visit. CMA was able to get the patient set up on a video visit.  Patient location: Home. Patient and provider in visit Provider location: Office  I discussed the limitations of evaluation and management by telemedicine and the availability of in person appointments. The patient expressed understanding and agreed to proceed.  Visit Date: 07/14/2024  Today's healthcare provider: Lucius Krabbe, NP     Subjective:   Patient ID: Peter Kirby, male    DOB: 03/21/1991, 33 y.o.   MRN: [id number]  Chief Complaint  Patient presents with   Generalized anxiety disorder  Discussed the use of AI scribe software for clinical note transcription with the patient, who gave verbal consent to proceed.  History of Present Illness Peter Kirby is a 33 year old male who presents for follow-up regarding medication side effects related to anxiety management.  Initially started on Lexapro , he experienced headaches and fatigue. A month ago, he switched to venlafaxine  37.5 mg, which led to immediate sexual side effects, specifically difficulty with erection. After 2-3 weeks on venlafaxine , he returned to Lexapro , which still caused some sexual side effects, though less severe than with venlafaxine .  He is considering adding Wellbutrin to either medication to mitigate side effects. He is also interested in trying Prozac, as he heard it might have fewer sexual side effects due to its long-acting nature.  Assessment & Plan Anxiety Switched from Lexapro  to venlafaxine  due to headaches and fatigue, but venlafaxine  caused erectile dysfunction. Returned to Lexapro  with reduced sexual side effects, but still tired.. Discussed adding Wellbutrin to mitigate side effects and enhance  efficacy. Considered Prozac for potentially fewer sexual side effects d/t long acting. - Initiate Prozac 10 mg daily. Do not take with Effexor  or Lexapro .  - Advise morning dosing, ok to switch to nighttime if drowsiness occurs. - Reassess in 30 days for effectiveness and side effects.  Past Medical History:  Diagnosis Date   Abdominal pain 10/21/2020   Allergy    Anxiety    Anxiety 08/29/2021   Asthma    resolved; childhood exercise induced   Diarrhea 10/21/2020   IBS (irritable bowel syndrome)    Loss of weight 10/21/2020    Past Surgical History:  Procedure Laterality Date   BIOPSY  11/12/2020   Procedure: BIOPSY;  Surgeon: Shaaron Lamar HERO, MD;  [facility]: [facility];  Service: Endoscopy;;   COLONOSCOPY WITH PROPOFOL  N/A 11/12/2020   Surgeon: Shaaron Lamar HERO, MD; normal examined colon, normal-appearing TI, s/p sigmoid biopsy (benign)    Outpatient Medications Prior to Visit  Medication Sig Dispense Refill   Eluxadoline  (VIBERZI ) 75 MG TABS TAKE ONE TABLET (75 MG) BY MOUTH TWO TIMES DAILY. LIMIT ALCOHOLIC DRINKS TO NO MORE THAN 2 DRINKS WITHIN 24 HRS WHILE TAKING DUE TO RISK OF PANCREATITIS 180 tablet 3   propranolol  (INDERAL ) 10 MG tablet Take 1 tablet (10 mg total) by mouth 3 (three) times daily as needed. For increased anxiety. 30 tablet 2   venlafaxine  XR (EFFEXOR  XR) 37.5 MG 24 hr capsule Take 1 capsule (37.5 mg total) by mouth daily with breakfast. 301 capsule 1   No facility-administered medications prior to visit.    Allergies  Allergen Reactions   Bee Venom Hives  Objective:   Physical Exam Vitals and nursing note reviewed.  Constitutional:      General: Pt is not in acute distress.    Appearance: Normal appearance.  HENT:     Head: Normocephalic.  Pulmonary:     Effort: No respiratory distress.  Musculoskeletal:     Cervical back: Normal range of motion.  Skin:    General: Skin is dry.     Coloration: Skin is not pale.  Neurological:     Mental  Status: Pt is alert and oriented to person, place, and time.  Psychiatric:        Mood and Affect: Mood normal.   There were no vitals taken for this visit.  Wt Readings from Last 3 Encounters:  06/17/24 175 lb 11.2 oz (79.7 kg)  04/15/24 171 lb (77.6 kg)  12/28/23 159 lb 6 oz (72.3 kg)      I discussed the assessment and treatment plan with the patient. The patient was provided an opportunity to ask questions and all were answered. The patient agreed with the plan and demonstrated an understanding of the instructions.   The patient was advised to call back or seek an in-person evaluation if the symptoms worsen or if the condition fails to improve as anticipated.  Lucius Krabbe, NP [facility] [phone] (phone) [phone] (fax)  [facility]

## 2024-08-26 DIAGNOSIS — F411 Generalized anxiety disorder: Secondary | ICD-10-CM | POA: Diagnosis not present

## 2024-10-20 ENCOUNTER — Other Ambulatory Visit: Payer: Self-pay | Admitting: Family

## 2024-10-28 ENCOUNTER — Other Ambulatory Visit: Payer: Self-pay | Admitting: Family

## 2024-10-28 DIAGNOSIS — F411 Generalized anxiety disorder: Secondary | ICD-10-CM
